# Patient Record
Sex: Male | Born: 1955 | Race: White | Hispanic: Yes | Marital: Single | Smoking: Never smoker
Health system: Southern US, Community
[De-identification: ages and names within clinical notes are randomized; demographics above are authoritative.]

## PROBLEM LIST (undated history)

## (undated) DIAGNOSIS — I340 Nonrheumatic mitral (valve) insufficiency: Secondary | ICD-10-CM

## (undated) DIAGNOSIS — E785 Hyperlipidemia, unspecified: Secondary | ICD-10-CM

## (undated) DIAGNOSIS — E119 Type 2 diabetes mellitus without complications: Secondary | ICD-10-CM

## (undated) DIAGNOSIS — I2510 Atherosclerotic heart disease of native coronary artery without angina pectoris: Secondary | ICD-10-CM

## (undated) DIAGNOSIS — Z789 Other specified health status: Secondary | ICD-10-CM

## (undated) DIAGNOSIS — I10 Essential (primary) hypertension: Secondary | ICD-10-CM

## (undated) HISTORY — DX: Hyperlipidemia, unspecified: E78.5

## (undated) HISTORY — DX: Type 2 diabetes mellitus without complications: E11.9

## (undated) HISTORY — PX: ANKLE SURGERY: SHX546

## (undated) HISTORY — DX: Essential (primary) hypertension: I10

## (undated) HISTORY — DX: Atherosclerotic heart disease of native coronary artery without angina pectoris: I25.10

## (undated) HISTORY — DX: Nonrheumatic mitral (valve) insufficiency: I34.0

---

## 1998-08-27 ENCOUNTER — Encounter: Admission: RE | Admit: 1998-08-27 | Discharge: 1998-11-25 | Payer: Self-pay | Admitting: *Deleted

## 1998-10-21 ENCOUNTER — Emergency Department (HOSPITAL_COMMUNITY): Admission: EM | Admit: 1998-10-21 | Discharge: 1998-10-21 | Payer: Self-pay

## 2009-08-01 ENCOUNTER — Emergency Department (HOSPITAL_COMMUNITY): Admission: EM | Admit: 2009-08-01 | Discharge: 2009-08-01 | Payer: Self-pay | Admitting: Emergency Medicine

## 2009-08-26 ENCOUNTER — Ambulatory Visit: Admission: RE | Admit: 2009-08-26 | Discharge: 2009-08-26 | Payer: Self-pay | Admitting: Orthopedic Surgery

## 2010-04-03 LAB — CBC
Hemoglobin: 16.5 g/dL (ref 13.0–17.0)
MCH: 30.2 pg (ref 26.0–34.0)
RBC: 5.46 MIL/uL (ref 4.22–5.81)
RDW: 12.5 % (ref 11.5–15.5)
WBC: 10.9 10*3/uL — ABNORMAL HIGH (ref 4.0–10.5)

## 2010-04-03 LAB — URINALYSIS, ROUTINE W REFLEX MICROSCOPIC
Glucose, UA: NEGATIVE mg/dL
Specific Gravity, Urine: 1.017 (ref 1.005–1.030)
pH: 6 (ref 5.0–8.0)

## 2010-04-03 LAB — COMPREHENSIVE METABOLIC PANEL
ALT: 57 U/L — ABNORMAL HIGH (ref 0–53)
Alkaline Phosphatase: 67 U/L (ref 39–117)
BUN: 12 mg/dL (ref 6–23)
Creatinine, Ser: 0.88 mg/dL (ref 0.4–1.5)
GFR calc non Af Amer: >60 mL/min (ref >60)
Potassium: 4.3 mEq/L (ref 3.5–5.1)

## 2010-04-03 LAB — URINE CULTURE
Colony Count: 4000
Culture  Setup Time: 201108091052

## 2010-04-03 LAB — ABO/RH: ABO/RH(D): O POS

## 2010-04-03 LAB — DIFFERENTIAL
Basophils Relative: 2 % — ABNORMAL HIGH (ref 0–1)
Eosinophils Absolute: 1.5 10*3/uL — ABNORMAL HIGH (ref 0.0–0.7)
Lymphs Abs: 4 10*3/uL (ref 0.7–4.0)
Monocytes Absolute: 0.8 10*3/uL (ref 0.1–1.0)
Neutro Abs: 4.4 10*3/uL (ref 1.7–7.7)

## 2010-04-03 LAB — SURGICAL PCR SCREEN
MRSA, PCR: NEGATIVE
Staphylococcus aureus: POSITIVE — AB

## 2010-04-03 LAB — TYPE AND SCREEN
ABO/RH(D): O POS
Antibody Screen: NEGATIVE

## 2010-04-03 LAB — PROTIME-INR: INR: 0.96 (ref 0.00–1.49)

## 2014-08-18 ENCOUNTER — Emergency Department (HOSPITAL_COMMUNITY): Payer: 59

## 2014-08-18 ENCOUNTER — Encounter (HOSPITAL_COMMUNITY): Payer: Self-pay | Admitting: *Deleted

## 2014-08-18 ENCOUNTER — Observation Stay (HOSPITAL_COMMUNITY)
Admission: EM | Admit: 2014-08-18 | Discharge: 2014-08-19 | Disposition: A | Discharge status: Home / Self Care | Payer: 59 | Attending: Internal Medicine | Admitting: Internal Medicine

## 2014-08-18 DIAGNOSIS — J45909 Unspecified asthma, uncomplicated: Secondary | ICD-10-CM | POA: Insufficient documentation

## 2014-08-18 DIAGNOSIS — R5383 Other fatigue: Secondary | ICD-10-CM | POA: Diagnosis not present

## 2014-08-18 DIAGNOSIS — Z8249 Family history of ischemic heart disease and other diseases of the circulatory system: Secondary | ICD-10-CM | POA: Insufficient documentation

## 2014-08-18 DIAGNOSIS — R079 Chest pain, unspecified: Principal | ICD-10-CM | POA: Insufficient documentation

## 2014-08-18 DIAGNOSIS — R0602 Shortness of breath: Secondary | ICD-10-CM | POA: Insufficient documentation

## 2014-08-18 DIAGNOSIS — Z791 Long term (current) use of non-steroidal anti-inflammatories (NSAID): Secondary | ICD-10-CM | POA: Diagnosis not present

## 2014-08-18 DIAGNOSIS — R9431 Abnormal electrocardiogram [ECG] [EKG]: Secondary | ICD-10-CM | POA: Diagnosis not present

## 2014-08-18 DIAGNOSIS — R0789 Other chest pain: Secondary | ICD-10-CM | POA: Diagnosis present

## 2014-08-18 HISTORY — DX: Other specified health status: Z78.9

## 2014-08-18 LAB — CBC
HCT: 41.9 % (ref 39.0–52.0)
Hemoglobin: 15 g/dL (ref 13.0–17.0)
MCH: 30.2 pg (ref 26.0–34.0)
MCHC: 35.8 g/dL (ref 30.0–36.0)
MCV: 84.3 fL (ref 78.0–100.0)
PLATELETS: 168 10*3/uL (ref 150–400)
RBC: 4.97 MIL/uL (ref 4.22–5.81)
RDW: 12.6 % (ref 11.5–15.5)
WBC: 13.8 10*3/uL — AB (ref 4.0–10.5)

## 2014-08-18 LAB — BRAIN NATRIURETIC PEPTIDE: B Natriuretic Peptide: 8.7 pg/mL (ref 0.0–100.0)

## 2014-08-18 LAB — TROPONIN I
Troponin I: <0.03 ng/mL (ref <0.031)
Troponin I: <0.03 ng/mL (ref <0.031)

## 2014-08-18 LAB — BASIC METABOLIC PANEL
ANION GAP: 8 (ref 5–15)
BUN: 10 mg/dL (ref 6–20)
CHLORIDE: 102 mmol/L (ref 101–111)
CO2: 24 mmol/L (ref 22–32)
CREATININE: 1.11 mg/dL (ref 0.61–1.24)
Calcium: 9.2 mg/dL (ref 8.9–10.3)
Glucose, Bld: 199 mg/dL — ABNORMAL HIGH (ref 65–99)
Potassium: 3.3 mmol/L — ABNORMAL LOW (ref 3.5–5.1)
Sodium: 134 mmol/L — ABNORMAL LOW (ref 135–145)

## 2014-08-18 LAB — TSH: TSH: 3.858 u[IU]/mL (ref 0.350–4.500)

## 2014-08-18 LAB — D-DIMER, QUANTITATIVE: D-Dimer, Quant: <0.27 ug/mL-FEU (ref 0.00–0.48)

## 2014-08-18 MED ORDER — NITROGLYCERIN 0.4 MG SL SUBL: 0.4000 mg | SUBLINGUAL_TABLET | SUBLINGUAL | Status: DC | PRN

## 2014-08-18 MED ORDER — ASPIRIN EC 325 MG PO TBEC
325.0000 mg | DELAYED_RELEASE_TABLET | Freq: Every day | ORAL | Status: DC
Start: 2014-08-18 — End: 2014-08-19
  Administered 2014-08-18 – 2014-08-19 (×2): 325 mg via ORAL
  Filled 2014-08-18 (×2): qty 1

## 2014-08-18 MED ORDER — ONDANSETRON HCL 4 MG/2ML IJ SOLN: 4.0000 mg | Freq: Four times a day (QID) | INTRAMUSCULAR | Status: DC | PRN

## 2014-08-18 MED ORDER — ACETAMINOPHEN 325 MG PO TABS: 650.0000 mg | ORAL_TABLET | ORAL | Status: DC | PRN

## 2014-08-18 MED ORDER — ASPIRIN 81 MG PO CHEW
324.0000 mg | CHEWABLE_TABLET | Freq: Once | ORAL | Status: AC
  Administered 2014-08-18: 324 mg via ORAL
  Filled 2014-08-18: qty 4

## 2014-08-18 MED ORDER — ENOXAPARIN SODIUM 30 MG/0.3ML ~~LOC~~ SOLN
30.0000 mg | SUBCUTANEOUS | Status: DC
  Administered 2014-08-19: 30 mg via SUBCUTANEOUS
  Filled 2014-08-18: qty 0.3

## 2014-08-18 NOTE — H&P (Signed)
[facility] History and Physical  Darin Jones ZOX:[id number] DOB: June 20, 1955 DOA: 08/18/2014  Referring physician: ER physician. PCP: No primary care provider on file. none. Specialists: None.  Chief Complaint: Chest pain.  HPI: Darin Jones is a 59 y.o. male with no significant past medical history presents to the ER because of chest tightness and shortness of breath and palpitations. Patient states that this morning patient was working when patient started developing chest tightness with some palpitations and shortness of breath. Patient's symptoms improved after patient taking rest. Patient had a similar episode last week while exerting and patient had gone to the urgent care. Patient states he was discharged from there and was told that he had elevated glucose and lipids which will need further follow-up. In the ER patient was found to have negative cardiac markers and EKG showing sinus tachycardia and a chest x-ray unremarkable. Patient presently is asymptomatic and heart rate is within acceptable limits. Patient denies any productive cough fever or chills.   Review of Systems: As presented in the history of presenting illness, rest negative.  Past Medical History  Diagnosis Date  . Medical history non-contributory    Past Surgical History  Procedure Laterality Date  . Ankle surgery     Social History:  reports that he has never smoked. He does not have any smokeless tobacco history on file. He reports that he drinks alcohol. He reports that he does not use illicit drugs. Where does patient live at home. Can patient participate in ADLs? Yes.  No Known Allergies  Family History:  Family History  Problem Relation Age of Onset  . Hypertension Mother   . CAD Mother       Prior to Admission medications   Medication Sig Start Date End Date Taking? Authorizing Provider  cyclobenzaprine (FLEXERIL) 10 MG tablet Take 10 mg by mouth at bedtime.   Yes Historical Provider, MD   ibuprofen (ADVIL,MOTRIN) 200 MG tablet Take 400 mg by mouth daily as needed for headache (pain).   Yes Historical Provider, MD  naproxen (NAPROSYN) 500 MG tablet Take 500 mg by mouth 2 (two) times daily with a meal.   Yes Historical Provider, MD  SALINE NASAL SPRAY NA Place 1 spray into both nostrils at bedtime as needed (congestion/ nasal drip).   Yes Historical Provider, MD    Physical Exam: Filed Vitals:   08/18/14 1945 08/18/14 2000 08/18/14 2015 08/18/14 2101  BP: 131/85 130/84 134/85 130/98  Pulse: 80 81 81 86  Temp:    98.2 F (36.8 C)  TempSrc:    Oral  Resp: 15 13 10 14   Height:    5\' 6"  (1.676 m)  Weight:    75.161 kg (165 lb 11.2 oz)  SpO2: 97% 96% 98% 100%     General:  Moderately built and nourished.  Eyes: Anicteric pallor.  ENT: No discharge from the ears eyes nose and mouth.  Neck: No mass felt.  Cardiovascular: S1 and S2 heard.  Respiratory: No rhonchi or crepitations.  Abdomen: Soft nontender bowel sounds present.  Skin: No rash.  Musculoskeletal: No edema.  Psychiatric: Appears normal.  Neurologic: Alert awake oriented to time place and person. Moves all extremities.  Labs on Admission:  Basic Metabolic Panel:  Recent Labs Lab 08/18/14 1530  NA 134*  K 3.3*  CL 102  CO2 24  GLUCOSE 199*  BUN 10  CREATININE 1.11  CALCIUM 9.2   Liver Function Tests: No results for input(s): AST, ALT, ALKPHOS, BILITOT,  PROT, ALBUMIN in the last 168 hours. No results for input(s): LIPASE, AMYLASE in the last 168 hours. No results for input(s): AMMONIA in the last 168 hours. CBC:  Recent Labs Lab 08/18/14 1530  WBC 13.8*  HGB 15.0  HCT 41.9  MCV 84.3  PLT 168   Cardiac Enzymes:  Recent Labs Lab 08/18/14 1530 08/18/14 1931  TROPONINI <0.03 <0.03    BNP (last 3 results)  Recent Labs  08/18/14 1530  BNP 8.7    ProBNP (last 3 results) No results for input(s): PROBNP in the last 8760 hours.  CBG: No results for input(s): GLUCAP in  the last 168 hours.  Radiological Exams on Admission: Dg Chest 2 View  08/18/2014   CLINICAL DATA:  Chest pain shortness of breath for 7 days.  Asthma.  EXAM: CHEST  2 VIEW  COMPARISON:  None.  FINDINGS: The heart size and mediastinal contours are within normal limits. Both lungs are clear. No evidence of pneumothorax or pleural effusion. The visualized skeletal structures are unremarkable.  IMPRESSION: No active cardiopulmonary disease.   Electronically Signed   By: Myles Rosenthal M.D.   On: 08/18/2014 16:14    EKG: Independently reviewed. Sinus tachycardia.  Assessment/Plan Principal Problem:   Chest pain Active Problems:   Shortness of breath   1. Chest pain - given the exertional symptoms and recent diagnosis of elevated lipids and glucose levels at this time we will cycle cardiac markers and keep patient nothing by mouth in a.m. in anticipation of cardiac procedure. When necessary nitroglycerin. Aspirin. Check 2-D echo. Check d-dimer. 2. Hyperglycemia - check hemoglobin A1c.  I have reviewed patient's chest x-ray and EKG personally.   DVT Prophylaxis Lovenox.  Code Status: Full code.  Family Communication: Discussed with patient.  Disposition Plan: Admit for observation.    Minta Fair N. [facility] Pager [phone].  If 7PM-7AM, please contact night-coverage [email or web address] Password [facility] 08/18/2014, 9:27 PM

## 2014-08-18 NOTE — ED Provider Notes (Signed)
CSN: [id number]     Arrival date & time 08/18/14  1458 History   First MD Initiated Contact with Patient 08/18/14 1559     Chief Complaint  Patient presents with  . Shortness of Breath     (Consider location/radiation/quality/duration/timing/severity/associated sxs/prior Treatment) HPI Comments: 59 year old male with no significant past medical history who presents with shortness of breath and fatigue. The patient states that today after he cleaned his house and vacuumed, he began having heart palpitations and shortness of breath. These symptoms eventually went away after resting. Last week after cleaning a house, he had central chest pressure associated with left face and left arm tingling. He also felt heart racing and shortness of breath at this time. During that episode, his symptoms lasted approximately 1-2 hours and then improved with rest. The past 1.5 weeks, he has had shortness of breath when walking upstairs which is unusual for him. He denies any shortness of breath or chest pain currently. No fevers, cough/cold symptoms, nausea/vomiting, abdominal pain, or recent illness.  Family history negative for cardiac disease. Mother with type 2 diabetes mellitus and hypertension.  Patient is a 59 y.o. male presenting with shortness of breath. The history is provided by the patient.  Shortness of Breath   History reviewed. No pertinent past medical history. History reviewed. No pertinent past surgical history. No family history on file. History  Substance Use Topics  . Smoking status: Never Smoker   . Smokeless tobacco: Not on file  . Alcohol Use: Yes     Comment: occ    Review of Systems  Respiratory: Positive for shortness of breath.    10 Systems reviewed and are negative for acute change except as noted in the HPI.    Allergies  Review of patient's allergies indicates no known allergies.  Home Medications   Prior to Admission medications   Medication Sig Start Date End  Date Taking? Authorizing Provider  cyclobenzaprine (FLEXERIL) 10 MG tablet Take 10 mg by mouth at bedtime.   Yes Historical Provider, MD  ibuprofen (ADVIL,MOTRIN) 200 MG tablet Take 400 mg by mouth daily as needed for headache (pain).   Yes Historical Provider, MD  naproxen (NAPROSYN) 500 MG tablet Take 500 mg by mouth 2 (two) times daily with a meal.   Yes Historical Provider, MD  SALINE NASAL SPRAY NA Place 1 spray into both nostrils at bedtime as needed (congestion/ nasal drip).   Yes Historical Provider, MD   BP 130/84 mmHg  Pulse 91  Temp(Src) 99 F (37.2 C) (Oral)  Resp 22  Ht  (1.676 m)  Wt 170 lb (77.111 kg)  BMI 27.45 kg/m2  SpO2 98% Physical Exam  Constitutional: He is oriented to person, place, and time. He appears well-developed and well-nourished. No distress.  HENT:  Head: Normocephalic and atraumatic.  Moist mucous membranes  Eyes: Conjunctivae are normal. Pupils are equal, round, and reactive to light.  Neck: Neck supple.  Cardiovascular: Normal rate, regular rhythm and normal heart sounds.   No murmur heard. Pulmonary/Chest: Effort normal and breath sounds normal.  Abdominal: Soft. Bowel sounds are normal. He exhibits no distension. There is no tenderness.  Musculoskeletal: He exhibits no edema.  Neurological: He is alert and oriented to person, place, and time.  Fluent speech  Skin: Skin is warm and dry.  Psychiatric: He has a normal mood and affect. Judgment normal.  Nursing note and vitals reviewed.   ED Course  Procedures (including critical care time) Labs Review Labs Reviewed  BASIC METABOLIC PANEL - Abnormal; Notable for the following:    Sodium 134 (*)    Potassium 3.3 (*)    Glucose, Bld 199 (*)    All other components within normal limits  CBC - Abnormal; Notable for the following:    WBC 13.8 (*)    All other components within normal limits  TROPONIN I  BRAIN NATRIURETIC PEPTIDE  TROPONIN I    Imaging Review Dg Chest 2  View  08/18/2014   CLINICAL DATA:  Chest pain shortness of breath for 7 days.  Asthma.  EXAM: CHEST  2 VIEW  COMPARISON:  None.  FINDINGS: The heart size and mediastinal contours are within normal limits. Both lungs are clear. No evidence of pneumothorax or pleural effusion. The visualized skeletal structures are unremarkable.  IMPRESSION: No active cardiopulmonary disease.   Electronically Signed   By: Myles Rosenthal M.D.   On: 08/18/2014 16:14     EKG Interpretation   Date/Time:  Sunday August 18 2014 15:07:28 EDT Ventricular Rate:  116 PR Interval:  144 QRS Duration: 84 QT Interval:  320 QTC Calculation: 444 R Axis:   125 Text Interpretation:  Sinus tachycardia Right axis deviation Abnormal ECG  ABNORMAL R WAVE PROGRESSION Confirmed by Aissata Wilmore MD, Corisa Montini ([id number]) on  08/18/2014 4:05:07 PM       MDM   Final diagnoses:  None   shortness of breath with exertion Heart palpitations with exertion Fatigue Chest tightness  59 year old male presents with episode of shortness of breath and palpitation after vacuuming. Has had recent episode of chest pain and left arm tingling associated with shortness of breath after exertion. Patient well-appearing with reassuring vital signs at presentation. EKG shows no signs of ischemia. Obtained labs listed above including serial troponins. Initial troponin negative. Patient has no risk factors for blood clots thus I feel PE is unlikely. Chest x-ray unremarkable. Gave the patient 324 mg aspirin. He currently denies any chest pain or tightness.  Patient's heart score is at least 6 given his age, suspicious history and abnormal R-wave progression on EKG. I am concerned that his symptoms may represent unstable angina. The patient will be admitted for ACS rule out.  Laurence Spates, MD 08/18/14 [phone]

## 2014-08-18 NOTE — ED Notes (Signed)
Pt states sob and palpitations while vacuuming today.  He went to [facility] and they sent him here.  States last week he felt chest pressure, L facial tingling and that his heart was racing, but it only lasted several minutes.  Also c/o sob when going up stairs x 1.5 weeks.

## 2014-08-19 ENCOUNTER — Encounter: Payer: Self-pay | Admitting: Physician Assistant

## 2014-08-19 ENCOUNTER — Other Ambulatory Visit: Payer: Self-pay | Admitting: Physician Assistant

## 2014-08-19 ENCOUNTER — Observation Stay (HOSPITAL_BASED_OUTPATIENT_CLINIC_OR_DEPARTMENT_OTHER): Payer: 59

## 2014-08-19 DIAGNOSIS — Z791 Long term (current) use of non-steroidal anti-inflammatories (NSAID): Secondary | ICD-10-CM | POA: Diagnosis not present

## 2014-08-19 DIAGNOSIS — R079 Chest pain, unspecified: Secondary | ICD-10-CM

## 2014-08-19 DIAGNOSIS — R0602 Shortness of breath: Secondary | ICD-10-CM | POA: Diagnosis not present

## 2014-08-19 DIAGNOSIS — J45909 Unspecified asthma, uncomplicated: Secondary | ICD-10-CM | POA: Diagnosis not present

## 2014-08-19 LAB — TROPONIN I
Troponin I: <0.03 ng/mL (ref <0.031)
Troponin I: <0.03 ng/mL (ref <0.031)

## 2014-08-19 LAB — BASIC METABOLIC PANEL
ANION GAP: 10 (ref 5–15)
BUN: 13 mg/dL (ref 6–20)
CO2: 27 mmol/L (ref 22–32)
CREATININE: 0.83 mg/dL (ref 0.61–1.24)
Calcium: 9.2 mg/dL (ref 8.9–10.3)
Chloride: 103 mmol/L (ref 101–111)
Glucose, Bld: 103 mg/dL — ABNORMAL HIGH (ref 65–99)
Potassium: 3.8 mmol/L (ref 3.5–5.1)
Sodium: 140 mmol/L (ref 135–145)

## 2014-08-19 LAB — URINALYSIS, ROUTINE W REFLEX MICROSCOPIC
BILIRUBIN URINE: NEGATIVE
Glucose, UA: NEGATIVE mg/dL
HGB URINE DIPSTICK: NEGATIVE
Ketones, ur: NEGATIVE mg/dL
LEUKOCYTES UA: NEGATIVE
Nitrite: NEGATIVE
PH: 6 (ref 5.0–8.0)
PROTEIN: NEGATIVE mg/dL
Specific Gravity, Urine: 1.023 (ref 1.005–1.030)
Urobilinogen, UA: 0.2 mg/dL (ref 0.0–1.0)

## 2014-08-19 LAB — LIPID PANEL
Cholesterol: 169 mg/dL (ref 0–200)
HDL: 42 mg/dL (ref >40)
LDL CALC: 109 mg/dL — AB (ref 0–99)
Total CHOL/HDL Ratio: 4 RATIO
Triglycerides: 91 mg/dL (ref <150)
VLDL: 18 mg/dL (ref 0–40)

## 2014-08-19 LAB — RAPID URINE DRUG SCREEN, HOSP PERFORMED
Amphetamines: NOT DETECTED
Barbiturates: NOT DETECTED
Benzodiazepines: NOT DETECTED
COCAINE: NOT DETECTED
OPIATES: NOT DETECTED
TETRAHYDROCANNABINOL: NOT DETECTED

## 2014-08-19 MED ORDER — ASPIRIN EC 81 MG PO TBEC: 81.0000 mg | DELAYED_RELEASE_TABLET | Freq: Every day | ORAL | Status: AC

## 2014-08-19 MED ORDER — NITROGLYCERIN 0.4 MG SL SUBL: 0.4000 mg | SUBLINGUAL_TABLET | SUBLINGUAL | Status: DC | PRN

## 2014-08-19 MED ORDER — UNABLE TO FIND: Status: DC

## 2014-08-19 NOTE — Discharge Summary (Signed)
[facility]  Physician Discharge Summary   Patient ID: Darin Jones MRN: [id number] DOB/AGE: 57-Jan-1957 57 y.o.  Admit date: 08/18/2014 Discharge date: 08/19/2014  PCP: Does not recall name  DISCHARGE DIAGNOSES:  Principal Problem:   Chest pain Active Problems:   Shortness of breath   RECOMMENDATIONS FOR OUTPATIENT FOLLOW UP: 1. Patient told that cardiology office will call for an outpatient stress test  DISCHARGE CONDITION: fair  Diet recommendation: Heart healthy  Filed Weights   08/18/14 1515 08/18/14 2101  Weight: 77.111 kg (170 lb) 75.161 kg (165 lb 11.2 oz)    INITIAL HISTORY: 59 year old male of Hispanic origin who does not have any significant past medical history, but presented with complaints of chest tightness and shortness of breath with exertion. He was hospitalized for further evaluation.  Consultations:  None  Procedures: 2-D echocardiogram Final report is pending for technical reasons. Discussed with Dr. Tenny Craw who reviewed his echocardiogram and mentioned that his ejection fraction was 55-60%. No other abnormalities were noted.  HOSPITAL COURSE:   Chest pain with exertion EKG was nonischemic. It was repeated this evening, which was also unremarkable. Troponins are negative 3. Patient's chest pain has resolved. Echocardiogram did not show any concerning findings. Patient will still benefit from outpatient stress test, which will be arranged. Cardiology will contact him. He'll be given prescriptions for aspirin and nitroglycerin. D Dimer is normal. TSh is normal.  His blood glucose level was elevated yesterday. Fasting level is 103. This will need outpatient management.  Patient has remained chest pain-free in the hospital. He is ruled out for acute coronary syndrome. His EKG is nonischemic. Stress test can be done as an outpatient.    PERTINENT LABS:  The results of significant diagnostics from this hospitalization (including imaging,  microbiology, ancillary and laboratory) are listed below for reference.      Labs: Basic Metabolic Panel:  Recent Labs Lab 08/18/14 1530 08/19/14 1130  NA 134* 140  K 3.3* 3.8  CL 102 103  CO2 24 27  GLUCOSE 199* 103*  BUN 10 13  CREATININE 1.11 0.83  CALCIUM 9.2 9.2   CBC:  Recent Labs Lab 08/18/14 1530  WBC 13.8*  HGB 15.0  HCT 41.9  MCV 84.3  PLT 168   Cardiac Enzymes:  Recent Labs Lab 08/18/14 1530 08/18/14 1931 08/18/14 2146 08/19/14 0327 08/19/14 1010  TROPONINI <0.03 <0.03 <0.03 <0.03 <0.03   BNP: BNP (last 3 results)  Recent Labs  08/18/14 1530  BNP 8.7    IMAGING STUDIES Dg Chest 2 View  08/18/2014   CLINICAL DATA:  Chest pain shortness of breath for 7 days.  Asthma.  EXAM: CHEST  2 VIEW  COMPARISON:  None.  FINDINGS: The heart size and mediastinal contours are within normal limits. Both lungs are clear. No evidence of pneumothorax or pleural effusion. The visualized skeletal structures are unremarkable.  IMPRESSION: No active cardiopulmonary disease.   Electronically Signed   By: Myles Rosenthal M.D.   On: 08/18/2014 16:14    DISCHARGE EXAMINATION: Filed Vitals:   08/18/14 2015 08/18/14 2101 08/19/14 0532 08/19/14 1316  BP: 134/85 130/98 114/82 128/72  Pulse: 81 86 77 80  Temp:  98.2 F (36.8 C) 97.8 F (36.6 C) 98.2 F (36.8 C)  TempSrc:  Oral Oral Oral  Resp: Height:   (1.676 m)    Weight:  75.161 kg (165 lb 11.2 oz)    SpO2: 98% 100% 100% 100%   General appearance:  alert, cooperative and no distress Resp: clear to auscultation bilaterally Cardio: regular rate and rhythm, S1, S2 normal, no murmur, click, rub or gallop GI: soft, non-tender; bowel sounds normal; no masses,  no organomegaly Extremities: extremities normal, atraumatic, no cyanosis or edema  DISPOSITION: Home  Discharge Instructions    Call MD for:  difficulty breathing, headache or visual disturbances    Complete by:  As directed      Call MD for:   extreme fatigue    Complete by:  As directed      Call MD for:  persistant dizziness or light-headedness    Complete by:  As directed      Call MD for:  persistant nausea and vomiting    Complete by:  As directed      Call MD for:  severe uncontrolled pain    Complete by:  As directed      Diet - low sodium heart healthy    Complete by:  As directed      Discharge instructions    Complete by:  As directed   Take the nitroglycerin only if you have chest pain and Seek immediate attention if you develop chest pain. You will get a call from the heart doctor's office regarding a cardiac stress test.    You were cared for by a hospitalist during your hospital stay. If you have any questions about your discharge medications or the care you received while you were in the hospital after you are discharged, you can call the unit and asked to speak with the hospitalist on call if the hospitalist that took care of you is not available. Once you are discharged, your primary care physician will handle any further medical issues. Please note that NO REFILLS for any discharge medications will be authorized once you are discharged, as it is imperative that you return to your primary care physician (or establish a relationship with a primary care physician if you do not have one) for your aftercare needs so that they can reassess your need for medications and monitor your lab values. If you do not have a primary care physician, you can call [phone] for a physician referral.     Increase activity slowly    Complete by:  As directed            ALLERGIES: No Known Allergies    Current Discharge Medication List    START taking these medications   Details  aspirin EC 81 MG tablet Take 1 tablet (81 mg total) by mouth daily. Qty: 30 tablet, Refills: 0    nitroGLYCERIN (NITROSTAT) 0.4 MG SL tablet Place 1 tablet (0.4 mg total) under the tongue every 5 (five) minutes as needed for chest pain. Qty: 15  tablet, Refills: 0    UNABLE TO FIND To Whom It May Concern: It is my medical opinion that Darin Jones should stay out of work till he undergoes a stress test. If you have any questions or concerns, please don't hesitate to call. Sincerely, Darin Shipper, MD [phone] [facility] Qty: 1 each, Refills: 0      CONTINUE these medications which have NOT CHANGED   Details  cyclobenzaprine (FLEXERIL) 10 MG tablet Take 10 mg by mouth at bedtime.    naproxen (NAPROSYN) 500 MG tablet Take 500 mg by mouth 2 (two) times daily with a meal.    SALINE NASAL SPRAY NA Place 1 spray into both nostrils at bedtime as needed (congestion/ nasal drip).  STOP taking these medications     ibuprofen (ADVIL,MOTRIN) 200 MG tablet          TOTAL DISCHARGE TIME: 35 minutes  [facility]  [facility] Pager [phone]  08/19/2014, 6:03 PM

## 2014-08-19 NOTE — Progress Notes (Signed)
Chaplain responded to request for advanced directive. Pt did not recall making request. Pt stated that he would want his wife to make medical decisions for him if he could not make them for himself. Chaplain left copy of advanced directive in Spanish for pt should he want to complete it. Pt is concerned about not eating since yesterday and wonders about what diet is ordered. Also curious about when he will see a doctor.  Chaplain offered to speak with pt nurse about this. Page chaplain as needed.   08/19/14 1000  Clinical Encounter Type  Visited With Patient  Visit Type Initial;Spiritual support  Referral From Nurse  Spiritual Encounters  Spiritual Needs Emotional  Stress Factors  Patient Stress Factors Lack of knowledge  Advance Directives (For Healthcare)  Does patient have an advance directive? No  Would patient like information on creating an advanced directive? Yes - Educational materials given  Jiles Harold 08/19/2014 10:07 AM

## 2014-08-19 NOTE — Progress Notes (Signed)
  Echocardiogram 2D Echocardiogram has been performed.  Darin Jones 08/19/2014, 3:38 PM

## 2014-08-19 NOTE — Progress Notes (Signed)
Patient to be discharged home. AVS reviewed with patient and prescriptions given. Awaiting wife for patient pickup.

## 2014-08-19 NOTE — Progress Notes (Signed)
Dr. Rito Ehrlich with internal medicine contacted our service this evening after discussing echo result with Dr. Tenny Craw, requesting outpatient exercise stress test. I have sent a message to our [facility] office's scheduler requesting this test as well as a follow-up appointment, and our office will call the patient with this information. Liala Codispoti PA-C

## 2014-08-19 NOTE — Discharge Instructions (Signed)
Dolor de pecho (no específico) °(Chest Pain (Nonspecific)) °Suele ser difícil diagnosticar la causa del dolor de pecho. Siempre hay una posibilidad de que el dolor podría estar relacionado con algo grave, como un ataque al corazón o un coágulo sanguíneo en los pulmones. Debe concurrir a las visitas de control con el médico. °CUIDADOS EN EL HOGAR °· Si le dieron antibióticos, tómelos como se lo haya indicado el médico. Finalice el medicamento, aunque comience a sentirse mejor. °· Durante los días siguientes, no haga actividades que provoquen dolor de pecho. Continúe con las actividades físicas como se lo haya indicado el médico. °· No use productos que contengan tabaco, que incluyen cigarrillos, tabaco para mascar y cigarrillos electrónicos. °· Evite el consumo de alcohol. °· Tome los medicamentos solamente como se lo haya indicado el médico. °· Siga las sugerencias del médico en lo que respecta a más pruebas, si el dolor de pecho no desaparece. °· Concurra a todas las visitas que concertó con el médico. °SOLICITE AYUDA SI: °· El dolor de pecho no desaparece, incluso después del tratamiento. °· Tiene una erupción cutánea con ampollas en el pecho. °· Tiene fiebre. °SOLICITE AYUDA DE INMEDIATO SI:  °· Aumenta el dolor de pecho o el dolor se irradia hacia el brazo, el cuello, la mandíbula, la espalda o el vientre (abdomen). °· Le falta el aire. °· Tose más de lo normal o tose con sangre. °· Siente un dolor muy intenso en la espalda o el vientre. °· Tiene malestar estomacal (náuseas) o vomita. °· Se siente muy débil. °· Pierde el conocimiento (se desmaya). °· Tiene escalofríos. °Esto es una emergencia. No espere a ver que los problemas desaparezcan. Llame a los servicios de emergencia locales (911 en los [place]). No conduzca por sus propios medios hasta el hospital. °ASEGÚRESE DE QUE:  °· Comprende estas instrucciones. °· Controlará su afección. °· Recibirá ayuda de inmediato si no mejora o si empeora. °Document  Released: 04/02/2008 Document Revised: 01/09/2013 °ExitCare® Patient Information ©2015 [employer]. This information is not intended to replace advice given to you by your health care provider. Make sure you discuss any questions you have with your health care provider. ° °

## 2014-08-20 ENCOUNTER — Telehealth (HOSPITAL_COMMUNITY): Payer: Self-pay | Admitting: *Deleted

## 2014-08-20 LAB — HEMOGLOBIN A1C
Hgb A1c MFr Bld: 6.3 % — ABNORMAL HIGH (ref 4.8–5.6)
Mean Plasma Glucose: 134 mg/dL

## 2014-08-22 ENCOUNTER — Telehealth (HOSPITAL_COMMUNITY): Payer: Self-pay | Admitting: *Deleted

## 2014-08-22 NOTE — Telephone Encounter (Signed)
Attempted to message in reference to upcoming appointment scheduled for 08/23/14. No answer machine in order to leave message. Kanishk Stroebel, Adelene Idler

## 2014-08-23 ENCOUNTER — Ambulatory Visit (HOSPITAL_COMMUNITY): Payer: 59 | Attending: Physician Assistant

## 2014-08-23 DIAGNOSIS — R9439 Abnormal result of other cardiovascular function study: Secondary | ICD-10-CM | POA: Diagnosis not present

## 2014-08-23 DIAGNOSIS — R0609 Other forms of dyspnea: Secondary | ICD-10-CM | POA: Insufficient documentation

## 2014-08-23 DIAGNOSIS — R079 Chest pain, unspecified: Secondary | ICD-10-CM | POA: Diagnosis present

## 2014-08-23 LAB — MYOCARDIAL PERFUSION IMAGING
CHL CUP NUCLEAR SRS: 2
CHL CUP RESTING HR STRESS: 90 {beats}/min
CHL RATE OF PERCEIVED EXERTION: 17
CSEPED: 6 min
CSEPPHR: 157 {beats}/min
Estimated workload: 7.7 METS
Exercise duration (sec): 30 s
LHR: 0.31
LV dias vol: 85 mL
LV sys vol: 36 mL
MPHR: 164 {beats}/min
NUC STRESS TID: 0.9
Percent HR: 95 %
SDS: 3
SSS: 5

## 2014-08-23 MED ORDER — TECHNETIUM TC 99M SESTAMIBI GENERIC - CARDIOLITE
10.2000 | Freq: Once | INTRAVENOUS | Status: AC | PRN
  Administered 2014-08-23: 10 via INTRAVENOUS

## 2014-08-23 MED ORDER — TECHNETIUM TC 99M SESTAMIBI GENERIC - CARDIOLITE
32.6000 | Freq: Once | INTRAVENOUS | Status: AC | PRN
  Administered 2014-08-23: 33 via INTRAVENOUS

## 2014-08-26 ENCOUNTER — Telehealth: Payer: Self-pay | Admitting: Physician Assistant

## 2014-08-26 NOTE — Telephone Encounter (Signed)
Left detailed message for patient to call the office to be scheduled to see Wilburt Finlay, PA on Wednesday 8/10.  I have placed him on Bryan Hager's schedule as he is FLEX APP and this is urgent work-in.  Wednesday if first available appointment.

## 2014-08-26 NOTE — Telephone Encounter (Signed)
Patient's stress test result came to me and Dr. Tenny Craw. This is a patient seen by IM who recommended outpatient stress test. The internal medicine physician had discussed the patient's echo with Dr. Tenny Craw, but we have not ever seen the patient. Stress test was abnormal, possibly suggestive of ischemia.       Nuclear stress EF: 58%.  There was no ST segment deviation noted during stress.  Findings consistent with ischemia.  The left ventricular ejection fraction is normal (55-65%).  This is an intermediate risk study.  There was a medium-sized, mild partially reversible basal to mid inferior perfusion defect. This was suggestive of possible infarct with significant peri-infarct ischemia. Normal EF and wall motion.    As Dr. Tenny Craw is out this week, I discussed with Dr. Patty Sermons. Triage - Please let patient know his stress test was abnormal and needs further discussion - please arrange ASAP new patient appointment. This is appropriate to add onto flex schedule as a new patient with high priority. Continue aspirin in the meantime. Please review ER precautions the patients and ask him to please return for worsening symptoms.  Dayna Dunn PA-C

## 2014-08-27 NOTE — Telephone Encounter (Signed)
Spoke with pt to be sure that he had received message about appt tomorrow with Wilburt Finlay. Pt aware of appt on 8/10 @ 1:30P with Wilburt Finlay. Pt states that he will be here tomorrow.

## 2014-08-28 ENCOUNTER — Encounter: Payer: Self-pay | Admitting: Physician Assistant

## 2014-08-28 ENCOUNTER — Ambulatory Visit (INDEPENDENT_AMBULATORY_CARE_PROVIDER_SITE_OTHER): Payer: 59 | Admitting: Physician Assistant

## 2014-08-28 VITALS — BP 122/80 | HR 100 | Ht 66.0 in | Wt 168.2 lb

## 2014-08-28 DIAGNOSIS — R931 Abnormal findings on diagnostic imaging of heart and coronary circulation: Secondary | ICD-10-CM | POA: Diagnosis not present

## 2014-08-28 DIAGNOSIS — R9439 Abnormal result of other cardiovascular function study: Secondary | ICD-10-CM

## 2014-08-28 LAB — CBC WITH DIFFERENTIAL/PLATELET
Basophils Absolute: 0.2 10*3/uL — ABNORMAL HIGH (ref 0.0–0.1)
Basophils Relative: 1.6 % (ref 0.0–3.0)
EOS ABS: 1.2 10*3/uL — AB (ref 0.0–0.7)
EOS PCT: 11.3 % — AB (ref 0.0–5.0)
HEMATOCRIT: 44.2 % (ref 39.0–52.0)
Hemoglobin: 15 g/dL (ref 13.0–17.0)
LYMPHS ABS: 2.9 10*3/uL (ref 0.7–4.0)
Lymphocytes Relative: 27 % (ref 12.0–46.0)
MCHC: 33.8 g/dL (ref 30.0–36.0)
MCV: 87.2 fl (ref 78.0–100.0)
MONOS PCT: 8.5 % (ref 3.0–12.0)
Monocytes Absolute: 0.9 10*3/uL (ref 0.1–1.0)
Neutro Abs: 5.5 10*3/uL (ref 1.4–7.7)
Neutrophils Relative %: 51.6 % (ref 43.0–77.0)
Platelets: 176 10*3/uL (ref 150.0–400.0)
RBC: 5.07 Mil/uL (ref 4.22–5.81)
RDW: 12.8 % (ref 11.5–15.5)
WBC: 10.6 10*3/uL — AB (ref 4.0–10.5)

## 2014-08-28 LAB — PROTIME-INR
INR: 1 ratio (ref 0.8–1.0)
Prothrombin Time: 11 s (ref 9.6–13.1)

## 2014-08-28 LAB — BASIC METABOLIC PANEL
BUN: 11 mg/dL (ref 6–23)
CALCIUM: 9.5 mg/dL (ref 8.4–10.5)
CO2: 30 mEq/L (ref 19–32)
CREATININE: 1.01 mg/dL (ref 0.40–1.50)
Chloride: 102 mEq/L (ref 96–112)
GFR: 81.03 mL/min (ref >60.00)
GLUCOSE: 104 mg/dL — AB (ref 70–99)
Potassium: 3.8 mEq/L (ref 3.5–5.1)
Sodium: 138 mEq/L (ref 135–145)

## 2014-08-28 MED ORDER — METOPROLOL TARTRATE 25 MG PO TABS: 12.5000 mg | ORAL_TABLET | Freq: Two times a day (BID) | ORAL | Status: DC

## 2014-08-28 NOTE — Patient Instructions (Signed)
Medication Instructions:  Your physician has recommended you make the following change in your medication:  START Metoprolol 12.5mg  (1/2 tablet) Twice Daily   Labwork: Bmet, Cbc, Pt/Inr today  Testing/Procedures: Your physician has requested that you have a cardiac catheterization. Cardiac catheterization is used to diagnose and/or treat various heart conditions. Doctors may recommend this procedure for a number of different reasons. The most common reason is to evaluate chest pain. Chest pain can be a symptom of coronary artery disease (CAD), and cardiac catheterization can show whether plaque is narrowing or blocking your heart's arteries. This procedure is also used to evaluate the valves, as well as measure the blood flow and oxygen levels in different parts of your heart. For further information please visit [email or web address] Please follow instruction sheet, as given.    Follow-Up: Your physician recommends that you schedule a follow-up appointment pending procedure   Any Other Special Instructions Will Be Listed Below (If Applicable).

## 2014-08-28 NOTE — Progress Notes (Addendum)
Patient ID: Darin Jones, male   DOB: 1955/09/06, 59 y.o.   MRN: [id number]    Date:  08/28/2014   ID:  Darin Jones, DOB July 23, 1955, MRN [id number]  PCP:  No primary care provider on file.  Primary Cardiologist:  New  No chief complaint on file.    History of Present Illness:  Darin Jones is a 59 y.o. male with no significant past medical history. He was seen at [facility] on July 31 for exertional chest pain and shortness of breath.  He ruled out for MI 2-D echocardiogram revealed ejection fraction 55-60% with no wall motion abnormalities.  He was set up and underwent nuclear stress testing as an outpatient which was read as having a medium-sized, mild partially reversible basal to mid inferior perfusion defect. This was suggestive of possible infarct with significant peri-infarct ischemia. Normal EF and wall motion.   Patient presented for follow-up with his stress test.  An interpreter was present during the interview and exam.  He appears to have periodic chest tightness and shortness of breath with activity but otherwise is doing well.  He seems to correlate nasal congestion with that tightness. Review of the hospital note indicates that was with exertion.  The patient currently denies nausea, vomiting, fever, shortness of breath, orthopnea, dizziness, PND, cough, abdominal pain, hematochezia, melena, lower extremity edema, claudication.  Wt Readings from Last 3 Encounters:  08/28/14 168 lb 3.2 oz (76.295 kg)  08/23/14 165 lb (74.844 kg)  08/18/14 165 lb 11.2 oz (75.161 kg)     Past Medical History  Diagnosis Date  . Medical history non-contributory     Current Outpatient Prescriptions  Medication Sig Dispense Refill  . aspirin EC 81 MG tablet Take 1 tablet (81 mg total) by mouth daily. 30 tablet 0  . cyclobenzaprine (FLEXERIL) 10 MG tablet Take 10 mg by mouth at bedtime.    . naproxen (NAPROSYN) 500 MG tablet Take 500 mg by mouth 2 (two) times daily with a meal.      . nitroGLYCERIN (NITROSTAT) 0.4 MG SL tablet Place 1 tablet (0.4 mg total) under the tongue every 5 (five) minutes as needed for chest pain. 15 tablet 0  . SALINE NASAL SPRAY NA Place 1 spray into both nostrils at bedtime as needed (congestion/ nasal drip).    [name] TO FIND To Whom It May Concern: It is my medical opinion that Darin Jones should stay out of work till he undergoes a stress test. If you have any questions or concerns, please don't hesitate to call. Sincerely, Osvaldo Shipper, MD [phone] [facility] 1 each 0   No current facility-administered medications for this visit.    Allergies:    Allergies  Allergen Reactions  . Plasticized Base [Plastibase] Rash    Social History:  The patient  reports that he has never smoked. He has never used smokeless tobacco. He reports that he drinks alcohol. He reports that he does not use illicit drugs.   Family history:   Family History  Problem Relation Age of Onset  . Hypertension Mother   . CAD Mother   . Diabetes Mother   . Asthma Father     ROS:  Please see the history of present illness.  All other systems reviewed and negative.   PHYSICAL EXAM: VS:  BP 122/80 mmHg  Pulse 100  Ht  (1.676 m)  Wt 168 lb 3.2 oz (76.295 kg)  BMI 27.16 kg/m2  SpO2 94% Well  nourished, well developed, in no acute distress HEENT: Pupils are equal round react to light accommodation extraocular movements are intact.  Neck: no JVDNo cervical lymphadenopathy. Cardiac: Regular rate and rhythm without murmurs rubs or gallops. Lungs:  clear to auscultation bilaterally, no wheezing, rhonchi or rales Abd: soft, nontender, positive bowel sounds all quadrants, no hepatosplenomegaly Ext: no lower extremity edema.  2+ radial and dorsalis pedis pulses. Skin: warm and dry Neuro:  Grossly normal    ASSESSMENT AND PLAN:  Abnormal nuclear stress test Medium-sized, mild partially reversible basal to mid inferior perfusion defect.  This was suggestive of possible infarct with significant peri-infarct ischemia.The patient will be scheduled for left heart catheterization.  We'll check all precatheterization labs.  The procedure and risks were reviewed including but not limited to death, myocardial infarction, stroke, arrythmias, bleeding, transfusion, emergency surgery, dye allergy, or renal dysfunction. The patient voices understanding and is agreeable to proceed.   We went over the procedure in detail through the use of an interpreter.  Showed him pictures of stents and how they are used.  I started him on Lopressor 12.5 mg twice daily. He is already taking aspirin.

## 2014-08-30 ENCOUNTER — Encounter (HOSPITAL_COMMUNITY)
Admission: RE | Disposition: A | Discharge status: Home / Self Care | Payer: Self-pay | Source: Ambulatory Visit | Attending: Cardiovascular Disease

## 2014-08-30 ENCOUNTER — Ambulatory Visit (HOSPITAL_COMMUNITY)
Admission: RE | Admit: 2014-08-30 | Discharge: 2014-08-30 | Disposition: A | Discharge status: Home / Self Care | Payer: 59 | Source: Ambulatory Visit | Attending: Cardiovascular Disease | Admitting: Cardiovascular Disease

## 2014-08-30 ENCOUNTER — Encounter (HOSPITAL_COMMUNITY): Payer: Self-pay | Admitting: Cardiovascular Disease

## 2014-08-30 DIAGNOSIS — Z7982 Long term (current) use of aspirin: Secondary | ICD-10-CM | POA: Insufficient documentation

## 2014-08-30 DIAGNOSIS — R072 Precordial pain: Secondary | ICD-10-CM | POA: Insufficient documentation

## 2014-08-30 DIAGNOSIS — R0789 Other chest pain: Secondary | ICD-10-CM | POA: Diagnosis not present

## 2014-08-30 DIAGNOSIS — I2510 Atherosclerotic heart disease of native coronary artery without angina pectoris: Secondary | ICD-10-CM | POA: Diagnosis not present

## 2014-08-30 DIAGNOSIS — Z8249 Family history of ischemic heart disease and other diseases of the circulatory system: Secondary | ICD-10-CM | POA: Diagnosis not present

## 2014-08-30 HISTORY — PX: CARDIAC CATHETERIZATION: SHX172

## 2014-08-30 SURGERY — LEFT HEART CATH AND CORONARY ANGIOGRAPHY
Anesthesia: LOCAL

## 2014-08-30 MED ORDER — VERAPAMIL HCL 2.5 MG/ML IV SOLN
INTRAVENOUS | Status: AC
  Filled 2014-08-30: qty 2

## 2014-08-30 MED ORDER — FENTANYL CITRATE (PF) 100 MCG/2ML IJ SOLN
INTRAMUSCULAR | Status: DC | PRN
  Administered 2014-08-30: 50 ug via INTRAVENOUS

## 2014-08-30 MED ORDER — SODIUM CHLORIDE 0.9 % WEIGHT BASED INFUSION
3.0000 mL/kg/h | INTRAVENOUS | Status: DC
  Administered 2014-08-30: 3 mL/kg/h via INTRAVENOUS

## 2014-08-30 MED ORDER — SODIUM CHLORIDE 0.9 % WEIGHT BASED INFUSION: 1.0000 mL/kg/h | INTRAVENOUS | Status: DC

## 2014-08-30 MED ORDER — SODIUM CHLORIDE 0.9 % IJ SOLN: 3.0000 mL | Freq: Two times a day (BID) | INTRAMUSCULAR | Status: DC

## 2014-08-30 MED ORDER — FENTANYL CITRATE (PF) 100 MCG/2ML IJ SOLN
INTRAMUSCULAR | Status: AC
  Filled 2014-08-30: qty 4

## 2014-08-30 MED ORDER — ASPIRIN 81 MG PO CHEW
81.0000 mg | CHEWABLE_TABLET | ORAL | Status: AC
  Administered 2014-08-30: 81 mg via ORAL

## 2014-08-30 MED ORDER — MIDAZOLAM HCL 2 MG/2ML IJ SOLN
INTRAMUSCULAR | Status: DC | PRN
  Administered 2014-08-30: 2 mg via INTRAVENOUS

## 2014-08-30 MED ORDER — LIDOCAINE HCL (PF) 1 % IJ SOLN
INTRAMUSCULAR | Status: AC
  Filled 2014-08-30: qty 30

## 2014-08-30 MED ORDER — HEPARIN SODIUM (PORCINE) 1000 UNIT/ML IJ SOLN
INTRAMUSCULAR | Status: AC
  Filled 2014-08-30: qty 1

## 2014-08-30 MED ORDER — SODIUM CHLORIDE 0.9 % IV SOLN: INTRAVENOUS | Status: AC

## 2014-08-30 MED ORDER — ASPIRIN 81 MG PO CHEW
CHEWABLE_TABLET | ORAL | Status: AC
  Filled 2014-08-30: qty 1

## 2014-08-30 MED ORDER — SODIUM CHLORIDE 0.9 % IV SOLN: 250.0000 mL | INTRAVENOUS | Status: DC | PRN

## 2014-08-30 MED ORDER — HEPARIN SODIUM (PORCINE) 1000 UNIT/ML IJ SOLN
INTRAMUSCULAR | Status: DC | PRN
  Administered 2014-08-30: 4000 [IU] via INTRAVENOUS

## 2014-08-30 MED ORDER — VERAPAMIL HCL 2.5 MG/ML IV SOLN
INTRAVENOUS | Status: DC | PRN
  Administered 2014-08-30: 08:00:00 via INTRA_ARTERIAL

## 2014-08-30 MED ORDER — SODIUM CHLORIDE 0.9 % IJ SOLN: 3.0000 mL | INTRAMUSCULAR | Status: DC | PRN

## 2014-08-30 MED ORDER — MIDAZOLAM HCL 2 MG/2ML IJ SOLN
INTRAMUSCULAR | Status: AC
  Filled 2014-08-30: qty 4

## 2014-08-30 SURGICAL SUPPLY — 14 items

## 2014-08-30 NOTE — H&P (View-Only) (Signed)
Patient ID: Darin Jones, male   DOB: 1955/09/06, 59 y.o.   MRN: [id number]    Date:  08/28/2014   ID:  Darin Jones, DOB July 23, 1955, MRN [id number]  PCP:  No primary care provider on file.  Primary Cardiologist:  New  No chief complaint on file.    History of Present Illness:  Darin Jones is a 59 y.o. male with no significant past medical history. He was seen at [facility] on July 31 for exertional chest pain and shortness of breath.  He ruled out for MI 2-D echocardiogram revealed ejection fraction 55-60% with no wall motion abnormalities.  He was set up and underwent nuclear stress testing as an outpatient which was read as having a medium-sized, mild partially reversible basal to mid inferior perfusion defect. This was suggestive of possible infarct with significant peri-infarct ischemia. Normal EF and wall motion.   Patient presented for follow-up with his stress test.  An interpreter was present during the interview and exam.  He appears to have periodic chest tightness and shortness of breath with activity but otherwise is doing well.  He seems to correlate nasal congestion with that tightness. Review of the hospital note indicates that was with exertion.  The patient currently denies nausea, vomiting, fever, shortness of breath, orthopnea, dizziness, PND, cough, abdominal pain, hematochezia, melena, lower extremity edema, claudication.  Wt Readings from Last 3 Encounters:  08/28/14 168 lb 3.2 oz (76.295 kg)  08/23/14 165 lb (74.844 kg)  08/18/14 165 lb 11.2 oz (75.161 kg)     Past Medical History  Diagnosis Date  . Medical history non-contributory     Current Outpatient Prescriptions  Medication Sig Dispense Refill  . aspirin EC 81 MG tablet Take 1 tablet (81 mg total) by mouth daily. 30 tablet 0  . cyclobenzaprine (FLEXERIL) 10 MG tablet Take 10 mg by mouth at bedtime.    . naproxen (NAPROSYN) 500 MG tablet Take 500 mg by mouth 2 (two) times daily with a meal.      . nitroGLYCERIN (NITROSTAT) 0.4 MG SL tablet Place 1 tablet (0.4 mg total) under the tongue every 5 (five) minutes as needed for chest pain. 15 tablet 0  . SALINE NASAL SPRAY NA Place 1 spray into both nostrils at bedtime as needed (congestion/ nasal drip).    [name] TO FIND To Whom It May Concern: It is my medical opinion that Darin Jones should stay out of work till he undergoes a stress test. If you have any questions or concerns, please don't hesitate to call. Sincerely, Osvaldo Shipper, MD [phone] [facility] 1 each 0   No current facility-administered medications for this visit.    Allergies:    Allergies  Allergen Reactions  . Plasticized Base [Plastibase] Rash    Social History:  The patient  reports that he has never smoked. He has never used smokeless tobacco. He reports that he drinks alcohol. He reports that he does not use illicit drugs.   Family history:   Family History  Problem Relation Age of Onset  . Hypertension Mother   . CAD Mother   . Diabetes Mother   . Asthma Father     ROS:  Please see the history of present illness.  All other systems reviewed and negative.   PHYSICAL EXAM: VS:  BP 122/80 mmHg  Pulse 100  Ht  (1.676 m)  Wt 168 lb 3.2 oz (76.295 kg)  BMI 27.16 kg/m2  SpO2 94% Well  nourished, well developed, in no acute distress HEENT: Pupils are equal round react to light accommodation extraocular movements are intact.  Neck: no JVDNo cervical lymphadenopathy. Cardiac: Regular rate and rhythm without murmurs rubs or gallops. Lungs:  clear to auscultation bilaterally, no wheezing, rhonchi or rales Abd: soft, nontender, positive bowel sounds all quadrants, no hepatosplenomegaly Ext: no lower extremity edema.  2+ radial and dorsalis pedis pulses. Skin: warm and dry Neuro:  Grossly normal    ASSESSMENT AND PLAN:  Abnormal nuclear stress test Medium-sized, mild partially reversible basal to mid inferior perfusion defect.  This was suggestive of possible infarct with significant peri-infarct ischemia.The patient will be scheduled for left heart catheterization.  We'll check all precatheterization labs.  The procedure and risks were reviewed including but not limited to death, myocardial infarction, stroke, arrythmias, bleeding, transfusion, emergency surgery, dye allergy, or renal dysfunction. The patient voices understanding and is agreeable to proceed.   We went over the procedure in detail through the use of an interpreter.  Showed him pictures of stents and how they are used.  I started him on Lopressor 12.5 mg twice daily. He is already taking aspirin.

## 2014-08-30 NOTE — Interval H&P Note (Signed)
History and Physical Interval Note:  08/30/2014 7:10 AM  Darin Jones  has presented today for cardiac cath with the diagnosis of unstable angina. The various methods of treatment have been discussed with the patient and family. After consideration of risks, benefits and other options for treatment, the patient has consented to  Procedure(s): Left Heart Cath and Coronary Angiography (N/A) as a surgical intervention .  The patient's history has been reviewed, patient examined, no change in status, stable for surgery.  I have reviewed the patient's chart and labs.  Questions were answered to the patient's satisfaction.    Cath Lab Visit (complete for each Cath Lab visit)  Clinical Evaluation Leading to the Procedure:   ACS: No.  Non-ACS:    Anginal Classification: CCS III  Anti-ischemic medical therapy: Minimal Therapy (1 class of medications)  Non-Invasive Test Results: Intermediate-risk stress test findings: cardiac mortality 1-3%/year  Prior CABG: No previous CABG         MCALHANY,CHRISTOPHER

## 2014-08-30 NOTE — Discharge Instructions (Signed)
Radial Site Care °Refer to this sheet in the next few weeks. These instructions provide you with information on caring for yourself after your procedure. Your caregiver may also give you more specific instructions. Your treatment has been planned according to current medical practices, but problems sometimes occur. Call your caregiver if you have any problems or questions after your procedure. °HOME CARE INSTRUCTIONS °· You may shower the day after the procedure. Remove the bandage (dressing) and gently wash the site with plain soap and water. Gently pat the site dry. °· Do not apply powder or lotion to the site. °· Do not submerge the affected site in water for 3 to 5 days. °· Inspect the site at least twice daily. °· Do not flex or bend the affected arm for 24 hours. °· No lifting over 5 pounds (2.3 kg) for 5 days after your procedure. °· Do not drive home if you are discharged the same day of the procedure. Have someone else drive you. °· You may drive 24 hours after the procedure unless otherwise instructed by your caregiver. °· Do not operate machinery or power tools for 24 hours. °· A responsible adult should be with you for the first 24 hours after you arrive home. °What to expect: °· Any bruising will usually fade within 1 to 2 weeks. °· Blood that collects in the tissue (hematoma) may be painful to the touch. It should usually decrease in size and tenderness within 1 to 2 weeks. °SEEK IMMEDIATE MEDICAL CARE IF: °· You have unusual pain at the radial site. °· You have redness, warmth, swelling, or pain at the radial site. °· You have drainage (other than a small amount of blood on the dressing). °· You have chills. °· You have a fever or persistent symptoms for more than 72 hours. °· You have a fever and your symptoms suddenly get worse. °· Your arm becomes pale, cool, tingly, or numb. °· You have heavy bleeding from the site. Hold pressure on the site. °Document Released: 02/06/2010 Document Revised:  03/29/2011 Document Reviewed: 02/06/2010 °ExitCare® Patient Information ©2015 [employer]. This information is not intended to replace advice given to you by your health care provider. Make sure you discuss any questions you have with your health care provider. ° °

## 2014-09-17 ENCOUNTER — Ambulatory Visit (INDEPENDENT_AMBULATORY_CARE_PROVIDER_SITE_OTHER): Payer: 59 | Admitting: Cardiology

## 2014-09-17 ENCOUNTER — Encounter: Payer: Self-pay | Admitting: Cardiology

## 2014-09-17 VITALS — BP 112/62 | HR 109 | Ht 67.0 in | Wt 172.2 lb

## 2014-09-17 DIAGNOSIS — R072 Precordial pain: Secondary | ICD-10-CM | POA: Diagnosis not present

## 2014-09-17 NOTE — Patient Instructions (Signed)
Medication Instructions:  Stay on Aspirin and Toprol   Labwork: -None  Testing/Procedures: -None  Follow-Up: -None  Any Other Special Instructions Will Be Listed Below (If Applicable).  Please get established with PCP Corinda Gubler).

## 2014-09-17 NOTE — Progress Notes (Signed)
09/17/2014 Darin Jones   02-Sep-1955  [id number]  Primary Physician No PCP Per Patient Primary Cardiologist: Dr. Clifton James   Reason for Visit/CC: Post hospital follow-up for chest pain; status post left heart catheterization  HPI:  Patient is a 59 year old Spanish-speaking male, who presents to clinic today for post hospital follow-up after undergoing recent left heart catheterization. The study was ordered given recent history of chest pain and a slightly abnormal nuclear stress test. The procedure was performed by Dr. 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 Continued medical therapy was recommended. He was continued on aspirin and Metroprolol. Laboratory work during his hospitalization revealed borderline diabetes with a hemoglobin A1c of 6.3. Lipids are slightly abnormal with LDL 109.  Since discharge from hospital, the patient reports that he has done fairly well. He denies any recurrent chest discomfort and no significant dyspnea. He reports full medication compliance with aspirin and Metroprolol. He denies any complications from his right radial access site.    He was under  Current Outpatient Prescriptions  Medication Sig Dispense Refill  . aspirin EC 81 MG tablet Take 1 tablet (81 mg total) by mouth daily. 30 tablet 0  . cetirizine (ZYRTEC) 10 MG tablet Take 10 mg by mouth daily.    . cyclobenzaprine (FLEXERIL) 10 MG tablet Take 10 mg by mouth at bedtime.    . metoprolol tartrate (LOPRESSOR) 25 MG tablet Take 0.5 tablets (12.5 mg total) by mouth 2 (two) times daily. 30 tablet 5  . naproxen (NAPROSYN) 500 MG tablet Take 500 mg by mouth 2 (two) times daily as needed for mild pain.     . nitroGLYCERIN (NITROSTAT) 0.4 MG SL tablet Place 1 tablet (0.4 mg total) under the tongue every 5 (five) minutes as needed for chest pain. 15 tablet 0  . SALINE NASAL SPRAY NA Place 1 spray into both nostrils at bedtime as  needed (congestion/ nasal drip).    [name] TO FIND To Whom It May Concern: It is my medical opinion that Darin Jones should stay out of work till he undergoes a stress test. If you have any questions or concerns, please don't hesitate to call. Sincerely, Osvaldo Shipper, MD [phone] [facility] 1 each 0   No current facility-administered medications for this visit.    Allergies  Allergen Reactions  . Plasticized Base [Plastibase] Rash    Social History   Social History  . Marital Status: Single    Spouse Name: N/A  . Number of Children: N/A  . Years of Education: N/A   Occupational History  . Not on file.   Social History Main Topics  . Smoking status: Never Smoker   . Smokeless tobacco: Never Used  . Alcohol Use: 0.0 oz/week    0 Standard drinks or equivalent per week     Comment: occ  . Drug Use: No  . Sexual Activity: Not on file   Other Topics Concern  . Not on file   Social History Narrative     Review of Systems: General: negative for chills, fever, night sweats or weight changes.  Cardiovascular: negative for chest pain, dyspnea on exertion, edema, orthopnea, palpitations, paroxysmal nocturnal dyspnea or shortness of breath Dermatological: negative for rash Respiratory: negative for cough or wheezing Urologic: negative for hematuria Abdominal: negative for nausea, vomiting, diarrhea, bright red blood per rectum, melena, or hematemesis Neurologic: negative for visual changes, syncope, or dizziness All other systems  reviewed and are otherwise negative except as noted above.    Blood pressure 112/62, pulse 109, height 5\' 7"  (1.702 m), weight 172 lb 2.7 oz (78.096 kg), SpO2 97 %.  General appearance: alert, cooperative and no distress Neck: no carotid bruit and no JVD Lungs: clear to auscultation bilaterally Heart: regular rate and rhythm, S1, S2 normal, no murmur, click, rub or gallop Extremities: no LEE Pulses: 2+ and symmetric Skin: warm  and dry Neurologic: Grossly normal  EKG Not performed  ASSESSMENT AND PLAN:   1. Nonobstructive CAD: Recent catheterization only showed mild, 25% distal LAD stenosis. Continue medical therapy and risk factor modification. Continue aspirin and beta blocker.  2. Hyperlipidemia: LDL slightly abnormal at 109. Will plan for lifestyle modification through diet and exercise as first-line therapy. The patient reports that he has started walking 30 minutes a day. He was encouraged to continue with daily physical activity.  3. Prediabetes: Hgb A1c was 6.3. He was notified of his risk for developing diabetes and was encouraged to adopt lifestyle modifications including increasing physical activity and adopting a heart healthy diet. We'll place referral to establishe care with a PCP.   PLAN  F/u in 1 yr.   Robbie Lis PA-C 09/17/2014 3:47 PM

## 2014-09-19 ENCOUNTER — Ambulatory Visit: Payer: 59 | Admitting: Family Medicine

## 2014-10-02 ENCOUNTER — Ambulatory Visit: Payer: 59 | Admitting: Cardiovascular Disease

## 2015-09-17 ENCOUNTER — Ambulatory Visit (INDEPENDENT_AMBULATORY_CARE_PROVIDER_SITE_OTHER): Payer: BLUE CROSS/BLUE SHIELD | Admitting: Cardiology

## 2015-09-17 ENCOUNTER — Encounter: Payer: Self-pay | Admitting: Cardiology

## 2015-09-17 VITALS — BP 116/90 | HR 72 | Ht 67.0 in | Wt 165.8 lb

## 2015-09-17 DIAGNOSIS — R072 Precordial pain: Secondary | ICD-10-CM | POA: Diagnosis not present

## 2015-09-17 DIAGNOSIS — I2510 Atherosclerotic heart disease of native coronary artery without angina pectoris: Secondary | ICD-10-CM | POA: Diagnosis not present

## 2015-09-17 MED ORDER — METOPROLOL TARTRATE 25 MG PO TABS: 25.0000 mg | ORAL_TABLET | Freq: Two times a day (BID) | ORAL | 5 refills | Status: DC

## 2015-09-17 NOTE — Progress Notes (Signed)
09/17/2015 Darin Jones   1955-06-05  [id number]  Primary Physician Tula Nakayama, MD Primary Cardiologist: Dr. Clifton Jones   Reason for Visit/CC: f/u for CAD  HPI:  Patient is a 60 year old male, from [place] followed by Dr. Aundra Jones, who presents to clinic today for routine follow-up. In 2016 he underwent a left heart catheterization. The study was ordered given chest pain and a slightly abnormal nuclear stress test. The procedure was performed by Dr. 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 Continued medical therapy was recommended. He was continued on aspirin and Metroprolol. Laboratory work during his hospitalization revealed borderline diabetes with a hemoglobin A1c of 6.3. Lipids were slightly abnormal with LDL at 109.   He presents to clinic today for 1 year follow-up. He has established care with a PCP who now follows his lipids and prediabetes. He reports that he recently had laboratory work conducted and both conditions were well-controlled through diet and exercise. He reports that he is currently not on any medications for his dyslipidemia nor his prediabetes.  From a cardiac standpoint, he reports that he has done well since his last OV. He denies any recurrent chest discomfort. No dyspnea. No lightheadedness, dizziness, syncope/near-syncope. He walks for exercise. He walks 3 days a week averaging 35 minutes at a time. No exertional symptoms. He reports full medication compliance. His diastolic blood pressure in clinic is slightly elevated in the 90s. He reports that his PCP also brought this to his attention that his diastolic pressures were a bit on the higher side. He is asymptomatic. EKG shows normal sinus rhythm. Heart rate 72 bpm.  Current Outpatient Prescriptions  Medication Sig Dispense Refill  . aspirin EC 81 MG tablet Take 1 tablet (81 mg total) by mouth daily. 30 tablet 0  . cetirizine  (ZYRTEC) 10 MG tablet Take 10 mg by mouth daily.    . cyclobenzaprine (FLEXERIL) 10 MG tablet Take 10 mg by mouth at bedtime.    . metoprolol tartrate (LOPRESSOR) 25 MG tablet Take 1 tablet (25 mg total) by mouth 2 (two) times daily. 30 tablet 5  . naproxen (NAPROSYN) 500 MG tablet Take 500 mg by mouth 2 (two) times daily as needed for mild pain.     . nitroGLYCERIN (NITROSTAT) 0.4 MG SL tablet Place 1 tablet (0.4 mg total) under the tongue every 5 (five) minutes as needed for chest pain. 15 tablet 0  . SALINE NASAL SPRAY NA Place 1 spray into both nostrils at bedtime as needed (congestion/ nasal drip).    Darin Jones TO FIND To Whom It May Concern: It is my medical opinion that Darin Jones should stay out of work till he undergoes a stress test. If you have any questions or concerns, please don't hesitate to call. Sincerely, Darin Shipper, MD [phone] [facility] 1 each 0   No current facility-administered medications for this visit.     Allergies  Allergen Reactions  . Plasticized Base [Plastibase] Rash    Social History   Social History  . Marital status: Single    Spouse name: N/A  . Number of children: N/A  . Years of education: N/A   Occupational History  . Not on file.   Social History Main Topics  . Smoking status: Never Smoker  . Smokeless tobacco: Never Used  . Alcohol use 0.0 oz/week     Comment: occ  . Drug use: No  . Sexual activity:  Not on file   Other Topics Concern  . Not on file   Social History Narrative  . No narrative on file     Review of Systems: General: negative for chills, fever, night sweats or weight changes.  Cardiovascular: negative for chest pain, dyspnea on exertion, edema, orthopnea, palpitations, paroxysmal nocturnal dyspnea or shortness of breath Dermatological: negative for rash Respiratory: negative for cough or wheezing Urologic: negative for hematuria Abdominal: negative for nausea, vomiting, diarrhea, bright red  blood per rectum, melena, or hematemesis Neurologic: negative for visual changes, syncope, or dizziness All other systems reviewed and are otherwise negative except as noted above.    Blood pressure 116/90, pulse 72, height 5\' 7"  (1.702 m), weight 165 lb 12.8 oz (75.2 kg).  General appearance: alert, cooperative and no distress Neck: no carotid bruit and no JVD Lungs: clear to auscultation bilaterally Heart: regular rate and rhythm, S1, S2 normal, no murmur, click, rub or gallop Extremities: no LEE Pulses: 2+ and symmetric Skin: warm and dry Neurologic: Grossly normal  EKG NSR. No ischemia. 72 bpm.    ASSESSMENT AND PLAN:   1. Nonobstructive CAD: Left catheterization in 2016 only showed mild, 25% distal LAD stenosis. He denies any CP. Continue medical therapy and risk factor modification. Continue aspirin and beta blocker.   2. Hyperlipidemia: LDL was slightly abnormal at 109 mg/dL a year ago. This is now followed by his PCP and he reports that it is diet controlled. Not currently on any meds.  We discussed low fat diet + continuation of routine physical activity.   3. Prediabetes: Hgb A1c was 6.3 previously. This is followed by his PCP.  4. HTN: systolic BP in the 90s. Patient reports his PCP told him that his diastolic pressure was elevated at last visit. HR in the 70s. We will increase his metoprolol from 12.5 mg BID to 25 mg BID.   PLAN  F/u with Dr. Clifton JamesMcAlhany in 1 year.   Darin LisBrittainy Erick Murin PA-C 09/17/2015 3:04 PM

## 2015-09-17 NOTE — Patient Instructions (Signed)
Your physician has recommended you make the following change in your medication:  1.) INCREASE METOPROLOL TO 25 MG TWO TIMES A DAY  Your physician wants you to follow-up in: 1 YEAR WITH DR. Clifton JamesMCALHANY.   You will receive a reminder letter in the mail two months in advance. If you don't receive a letter, please call our office to schedule the follow-up appointment.

## 2015-11-26 ENCOUNTER — Other Ambulatory Visit: Payer: Self-pay | Admitting: Cardiology

## 2015-11-26 NOTE — Telephone Encounter (Signed)
Rx has been sent to the pharmacy electronically. ° °

## 2016-03-02 ENCOUNTER — Other Ambulatory Visit: Payer: Self-pay | Admitting: Cardiology

## 2016-03-03 ENCOUNTER — Other Ambulatory Visit: Payer: Self-pay | Admitting: Cardiology

## 2016-03-04 ENCOUNTER — Other Ambulatory Visit: Payer: Self-pay | Admitting: Cardiology

## 2016-03-20 ENCOUNTER — Other Ambulatory Visit: Payer: Self-pay | Admitting: Cardiology

## 2016-04-20 ENCOUNTER — Other Ambulatory Visit: Payer: Self-pay | Admitting: Cardiovascular Disease

## 2017-02-21 ENCOUNTER — Ambulatory Visit: Payer: BLUE CROSS/BLUE SHIELD | Admitting: Physician Assistant

## 2017-02-21 ENCOUNTER — Encounter: Payer: Self-pay | Admitting: Physician Assistant

## 2017-02-21 ENCOUNTER — Encounter (INDEPENDENT_AMBULATORY_CARE_PROVIDER_SITE_OTHER): Payer: Self-pay

## 2017-02-21 VITALS — BP 140/82 | HR 91 | Ht 67.0 in | Wt 167.1 lb

## 2017-02-21 DIAGNOSIS — I10 Essential (primary) hypertension: Secondary | ICD-10-CM | POA: Diagnosis not present

## 2017-02-21 DIAGNOSIS — R7303 Prediabetes: Secondary | ICD-10-CM

## 2017-02-21 DIAGNOSIS — I2510 Atherosclerotic heart disease of native coronary artery without angina pectoris: Secondary | ICD-10-CM | POA: Diagnosis not present

## 2017-02-21 DIAGNOSIS — E785 Hyperlipidemia, unspecified: Secondary | ICD-10-CM

## 2017-02-21 MED ORDER — METOPROLOL TARTRATE 25 MG PO TABS: 25.0000 mg | ORAL_TABLET | Freq: Two times a day (BID) | ORAL | 11 refills | Status: DC

## 2017-02-21 NOTE — Patient Instructions (Signed)
Your physician recommends that you continue on your current medications as directed. Please refer to the Current Medication list given to you today.  Your physician wants you to follow-up in: YEAR WITH DR MCALHANY  You will receive a reminder letter in the mail two months in advance. If you don't receive a letter, please call our office to schedule the follow-up appointment. 

## 2017-02-21 NOTE — Progress Notes (Signed)
Cardiology Office Note    Date:  02/21/2017   ID:  Darin Jones, DOB 1955-12-05, MRN [id number]  PCP:  Tula Nakayama, MD  Cardiologist: Verne Carrow, MD  Chief Complaint  Patient presents with  . Follow-up    History of Present Illness:  Darin Jones is a 62 y.o. 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  He was continued on aspirin and metoprolol.  He is borderline diabetic and has hyperlipidemia.  He last saw Boyce Medici, PA-C 09/17/15 and was doing well.  Metoprolol was increased to 25 mg twice daily for hypertension.  Patient comes in today for yearly follow-up.  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  He denies any chest pain, palpitations, dyspnea, dyspnea on exertion, dizziness or presyncope.  He was recently diagnosed with asthma and is finishing a prescription for amoxicillin.  He was also given albuterol inhaler and cetirizine.  He takes metformin for his diabetes.  He sees primary care next month at which time he will have his blood work drawn and cholesterol check.   Past Medical History:  Diagnosis Date  . Medical history non-contributory     Past Surgical History:  Procedure Laterality Date  . ANKLE SURGERY    . CARDIAC CATHETERIZATION N/A 08/30/2014   Procedure: Left Heart Cath and Coronary Angiography;  Surgeon: Kathleene Hazel, MD;  Location: [facility] INVASIVE CV LAB;  Service: Cardiovascular;  Laterality: N/A;    Current Medications: Current Meds  Medication Sig  . amoxicillin-clavulanate (AUGMENTIN) 875-125 MG tablet Take 1 tablet by mouth 2 (two) times daily.  Marland Kitchen aspirin EC 81 MG tablet Take 1 tablet (81 mg total) by mouth daily.  . cetirizine (ZYRTEC) 10 MG tablet Take 10 mg by mouth daily.  . metFORMIN (GLUCOPHAGE-XR) 500 MG 24 hr tablet Take 500 mg by mouth 2 (two) times daily.  . metoprolol tartrate (LOPRESSOR) 25 MG tablet Take 1 tablet (25 mg  total) by mouth 2 (two) times daily.  . nitroGLYCERIN (NITROSTAT) 0.4 MG SL tablet Place 1 tablet (0.4 mg total) under the tongue every 5 (five) minutes as needed for chest pain.  Marland Kitchen PROAIR HFA 108 (90 Base) MCG/ACT inhaler Inhale 2 puffs into the lungs daily.  Marland Kitchen SALINE NASAL SPRAY NA Place 1 spray into both nostrils at bedtime as needed (congestion/ nasal drip).  [name] TO FIND To Whom It May Concern: It is my medical opinion that Darin Jones should stay out of work till he undergoes a stress test. If you have any questions or concerns, please don't hesitate to call. Sincerely, Osvaldo Shipper, MD [phone] [facility]  . [DISCONTINUED] metoprolol tartrate (LOPRESSOR) 25 MG tablet Take 1 tablet (25 mg total) by mouth 2 (two) times daily.  . [DISCONTINUED] metoprolol tartrate (LOPRESSOR) 25 MG tablet Take 1 tablet (25 mg total) by mouth 2 (two) times daily.     Allergies:   Plasticized base [plastibase]   Social History   Socioeconomic History  . Marital status: Single    Spouse name: None  . Number of children: None  . Years of education: None  . Highest education level: None  Social Needs  . Financial resource strain: None  . Food insecurity - worry: None  . Food insecurity - inability: None  . Transportation needs - medical: None  . Transportation needs - non-medical: None  Occupational History  . None  Tobacco Use  . Smoking status: Never Smoker  . Smokeless tobacco: Never Used  Substance and Sexual Activity  . Alcohol use: Yes    Alcohol/week: 0.0 oz    Comment: occ  . Drug use: No  . Sexual activity: None  Other Topics Concern  . None  Social History Narrative  . None     Family History:  The patient's family history includes Asthma in his father; CAD in his mother; Diabetes in his mother; Hypertension in his mother.   ROS:   Please see the history of present illness.    Review of Systems  Constitution: Negative.  HENT: Negative.     Cardiovascular: Negative.   Respiratory: Positive for wheezing.   Endocrine: Negative.   Hematologic/Lymphatic: Negative.   Musculoskeletal: Negative.   Gastrointestinal: Negative.   Genitourinary: Negative.   Neurological: Negative.    All other systems reviewed and are negative.   PHYSICAL EXAM:   VS:  BP 140/82   Pulse 91   Ht 5\' 7"  (1.702 m)   Wt 167 lb 1.9 oz (75.8 kg)   SpO2 98%   BMI 26.17 kg/m   Physical Exam  GEN: Well nourished, well developed, in no acute distress  Neck: no JVD, carotid bruits, or masses Cardiac:RRR; positive S4, no murmurs, rubs Respiratory:  clear to auscultation bilaterally, normal work of breathing GI: soft, nontender, nondistended, + BS Ext: without cyanosis, clubbing, or edema, Good distal pulses bilaterally Neuro:  Alert and Oriented x 3 Psych: euthymic mood, full affect  Wt Readings from Last 3 Encounters:  02/21/17 167 lb 1.9 oz (75.8 kg)  09/17/15 165 lb 12.8 oz (75.2 kg)  09/17/14 172 lb 2.7 oz (78.1 kg)      Studies/Labs Reviewed:   EKG:  EKG is  ordered today.  The ekg ordered today demonstrates normal sinus rhythm, normal EKG  Recent Labs: No results found for requested labs within last 8760 hours.   Lipid Panel    Component Value Date/Time   CHOL 169 08/19/2014 0327   TRIG 91 08/19/2014 0327   HDL 42 08/19/2014 0327   CHOLHDL 4.0 08/19/2014 0327   VLDL 18 08/19/2014 0327   LDLCALC 109 (H) 08/19/2014 0327    Additional studies/ records that were reviewed today include:  2D echo 08/19/14 Study Conclusions   - Left ventricle: The cavity size was normal. Wall thickness was   normal. Systolic function was normal. The estimated ejection   fraction was in the range of 55% to 60%. Doppler parameters are   consistent with abnormal left ventricular relaxation (grade 1   diastolic dysfunction). - Mitral valve: There was mild regurgitation.    Cardiac cath 8/12/16Conclusion    Dist LAD lesion, 25% stenosed.  The  left ventricular systolic function is normal.   1. Mild non-obstructive CAD 2. Normal LV systolic function 3. Non-cardiac chest pain   Recommendations: Medical management of CAD.      ASSESSMENT:    1. Coronary artery disease involving native coronary artery of native heart without angina pectoris   2. Essential hypertension   3. Hyperlipidemia, unspecified hyperlipidemia type   4. Pre-diabetes      PLAN:  In order of problems listed above:  CAD nonobstructive on cardiac cath 2016-25% distal LAD patient doing well without angina.  Continue aspirin and metoprolol.  Follow-up with Dr. Clifton James in 1 year.  Essential hypertension BP controlled with metoprolol  Hyperlipidemia controlled with diet at this point.  To have fasting lipid panel  by primary care next month.  Prediabetes managed with diet and exercise and metformin  Medication Adjustments/Labs and Tests Ordered: Current medicines are reviewed at length with the patient today.  Concerns regarding medicines are outlined above.  Medication changes, Labs and Tests ordered today are listed in the Patient Instructions below. Patient Instructions  Your physician recommends that you continue on your current medications as directed. Please refer to the Current Medication list given to you today.   Your physician wants you to follow-up in: YEAR WITH DR Lisette GrinderMCALHANY  You will receive a reminder letter in the mail two months in advance. If you don't receive a letter, please call our office to schedule the follow-up appointment.    Signed, Jacolyn ReedyMichele Skyeler Scalese, PA-C  02/21/2017 1:25 PM    [facility] [address], [place], [place]  [address] Phone: [phone]; Fax: [phone]) [phone]

## 2017-02-22 NOTE — Addendum Note (Signed)
Addended by: Michaelle CopasBAKER, Diesel Lina on: 02/22/2017 08:43 AM   Modules accepted: Orders

## 2018-01-25 ENCOUNTER — Other Ambulatory Visit: Payer: Self-pay | Admitting: Physician Assistant

## 2018-01-25 NOTE — Telephone Encounter (Signed)
Pt's pharmacy is requesting a refill on metformin. Would Dr. Clifton James like to refill this medication? Please address

## 2018-01-26 NOTE — Telephone Encounter (Signed)
Please send to primary care

## 2018-02-10 ENCOUNTER — Encounter: Payer: Self-pay | Admitting: Physician Assistant

## 2018-02-22 ENCOUNTER — Other Ambulatory Visit: Payer: Self-pay | Admitting: Physician Assistant

## 2018-03-01 ENCOUNTER — Encounter: Payer: Self-pay | Admitting: Physician Assistant

## 2018-03-01 NOTE — Progress Notes (Signed)
Cardiology Office Note    Date:  03/03/2018  ID:  Darin Jones, DOB 01-08-1956, MRN [id number] PCP:  Tula Nakayama, MD  Cardiologist:  Verne Carrow, MD   Chief Complaint: f/u CAD  History of Present Illness:  Darin Jones is a 63 y.o. 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 Last echo in 2016 showed EF 55-60%, grade 1 DD, mild MR. Last labs in 2016 showed K 3.8, Cr 1.01, WBC 10.6 (chronic), Hgb 15, LDL 109, TSH wnl, A1C 6.3. He reports his labs have since been followed by a clinic on [address] but he is not sure which one. He speaks some English but is accompanied by an interpreter to help facilitate understanding. He's remained active in his regular work with a lot of walking and denies any recent anginal symptoms or SOB. BP well controlled. No LEE, wheezing, diaphoresis or palpitations. His only complaint is sometimes when he drinks something really cold he feels his asthma flare up temporarily. His brothers all have this condition as well.    Past Medical History:  Diagnosis Date  . Diabetes mellitus (HCC)   . Essential hypertension   . Hyperlipidemia   . Medical history non-contributory   . Mild CAD    a. 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  . Mild mitral regurgitation     Past Surgical History:  Procedure Laterality Date  . ANKLE SURGERY    . CARDIAC CATHETERIZATION N/A 08/30/2014   Procedure: Left Heart Cath and Coronary Angiography;  Surgeon: Kathleene Hazel, MD;  Location: [facility] INVASIVE CV LAB;  Service: Cardiovascular;  Laterality: N/A;    Current Medications: Current Meds  Medication Sig  . aspirin EC 81 MG tablet Take 1 tablet (81 mg total) by mouth daily.  Marland Kitchen atorvastatin (LIPITOR) 20 MG tablet Take 20 mg by mouth at bedtime.  . cetirizine  (ZYRTEC) 10 MG tablet Take 10 mg by mouth daily as needed for allergies.  . metFORMIN (GLUCOPHAGE-XR) 500 MG 24 hr tablet TAKE ONE TABLET BY MOUTH EVERY DAY  . metoprolol tartrate (LOPRESSOR) 25 MG tablet TAKE ONE TABLET BY MOUTH TWICE DAILY  . PROAIR HFA 108 (90 Base) MCG/ACT inhaler Inhale 2 puffs into the lungs daily.  Marland Kitchen SALINE NASAL SPRAY NA Place 1 spray into both nostrils at bedtime as needed (congestion/ nasal drip).      Allergies:   Plasticized base [plastibase]   Social History   Socioeconomic History  . Marital status: Single    Spouse name: Not on file  . Number of children: Not on file  . Years of education: Not on file  . Highest education level: Not on file  Occupational History  . Not on file  Social Needs  . Financial resource strain: Not on file  . Food insecurity:    Worry: Not on file    Inability: Not on file  . Transportation needs:    Medical: Not on file    Non-medical: Not on file  Tobacco Use  . Smoking status: Never Smoker  . Smokeless tobacco: Never Used  Substance and Sexual Activity  . Alcohol use: Yes    Alcohol/week: 0.0 standard drinks    Comment: occ  . Drug use: No  . Sexual activity: Not on file  Lifestyle  . Physical  activity:    Days per week: Not on file    Minutes per session: Not on file  . Stress: Not on file  Relationships  . Social connections:    Talks on phone: Not on file    Gets together: Not on file    Attends religious service: Not on file    Active member of club or organization: Not on file    Attends meetings of clubs or organizations: Not on file    Relationship status: Not on file  Other Topics Concern  . Not on file  Social History Narrative  . Not on file     Family History:  The patient's family history includes Asthma in his father; CAD in his mother; Diabetes in his mother; Hypertension in his mother.  ROS:   Please see the history of present illness. All other systems are reviewed and otherwise  negative.    PHYSICAL EXAM:   VS:  BP 126/70   Pulse 85   Ht 5\' 7"  (1.702 m)   Wt 172 lb 12.8 oz (78.4 kg)   SpO2 97%   BMI 27.06 kg/m   BMI: Body mass index is 27.06 kg/m. GEN: Well nourished, well developed Hispanic M, in no acute distress HEENT: normocephalic, atraumatic Neck: no JVD, carotid bruits, or masses Cardiac: RRR; no murmurs, rubs, or gallops, no edema  Respiratory:  clear to auscultation bilaterally, normal work of breathing GI: soft, nontender, nondistended, + BS MS: no deformity or atrophy Skin: warm and dry, no rash Neuro:  Alert and Oriented x 3, Strength and sensation are intact, follows commands Psych: euthymic mood, full affect  Wt Readings from Last 3 Encounters:  03/03/18 172 lb 12.8 oz (78.4 kg)  02/21/17 167 lb 1.9 oz (75.8 kg)  09/17/15 165 lb 12.8 oz (75.2 kg)      Studies/Labs Reviewed:   EKG:  EKG was ordered today and personally reviewed by me and demonstrates NSR 82bpm, left axis deviation otherwise no acute changes. No change from previous.   Recent Labs: No results found for requested labs within last 8760 hours.   Lipid Panel    Component Value Date/Time   CHOL 169 08/19/2014 0327   TRIG 91 08/19/2014 0327   HDL 42 08/19/2014 0327   CHOLHDL 4.0 08/19/2014 0327   VLDL 18 08/19/2014 0327   LDLCALC 109 (H) 08/19/2014 0327    Additional studies/ records that were reviewed today include: Summarized above   ASSESSMENT & PLAN:   1. Mild CAD - minimal by cath 2015, asymptomatic on medical therapy with ASA, BB, statin. His lipids are monitored through PCP and I asked him to call their office when he can and request they fax us a copy of his labs for our review. He does not remember the name of the practice for us to call. The current PCP he has listed is different than who he has been seeing. Goal LDL would be <70 given mild CAD - consideration could be given to titrating atorvastatin if not at goal. 2. Mild MR - guidelines suggest  repeat echo every 3-5 years. Will go ahead and obtain. Doubt significant acute change on exam.  3. Essential HTN - controlled on metoprolol. This is also followed by PCP. Await faxed labs before making recommendation on potentially adding low dose ACEI/ARB given borderline DM. If sugar is well-controlled, we can continue present regimen since he is feeling great, but needs regular follow-up of both DM and renal function by primary care.  His last visit with them was about 4 months ago with labs per his report. 4. Hyperlipidemia - see above re: lipids/statin.  Disposition: F/u with Dr. Clifton JamesMcAlhany in 1 year.  Medication Adjustments/Labs and Tests Ordered: Current medicines are reviewed at length with the patient today.  Concerns regarding medicines are outlined above. Medication changes, Labs and Tests ordered today are summarized above and listed in the Patient Instructions accessible in Encounters.   Signed, Laurann Montanaayna N , PA-C  03/03/2018 3:48 PM    [facility] [address], [place], [place]  [address] Phone: [phone]; Fax: [phone]) [phone]

## 2018-03-03 ENCOUNTER — Ambulatory Visit (INDEPENDENT_AMBULATORY_CARE_PROVIDER_SITE_OTHER): Payer: BLUE CROSS/BLUE SHIELD | Admitting: Physician Assistant

## 2018-03-03 ENCOUNTER — Telehealth: Payer: Self-pay | Admitting: Physician Assistant

## 2018-03-03 ENCOUNTER — Encounter: Payer: Self-pay | Admitting: Physician Assistant

## 2018-03-03 ENCOUNTER — Encounter (INDEPENDENT_AMBULATORY_CARE_PROVIDER_SITE_OTHER): Payer: Self-pay

## 2018-03-03 VITALS — BP 126/70 | HR 85 | Ht 67.0 in | Wt 172.8 lb

## 2018-03-03 DIAGNOSIS — I10 Essential (primary) hypertension: Secondary | ICD-10-CM

## 2018-03-03 DIAGNOSIS — I2510 Atherosclerotic heart disease of native coronary artery without angina pectoris: Secondary | ICD-10-CM

## 2018-03-03 DIAGNOSIS — I340 Nonrheumatic mitral (valve) insufficiency: Secondary | ICD-10-CM

## 2018-03-03 DIAGNOSIS — E785 Hyperlipidemia, unspecified: Secondary | ICD-10-CM

## 2018-03-03 MED ORDER — METOPROLOL TARTRATE 25 MG PO TABS: 25.0000 mg | ORAL_TABLET | Freq: Two times a day (BID) | ORAL | 3 refills | Status: AC

## 2018-03-03 NOTE — Patient Instructions (Addendum)
Medication Instructions:  Your physician recommends that you continue on your current medications as directed. Please refer to the Current Medication list given to you today.  If you need a refill on your cardiac medications before your next appointment, please call your pharmacy.   Lab work: None ordered  If you have labs (blood work) drawn today and your tests are completely normal, you will receive your results only by: Marland Kitchen MyChart Message (if you have MyChart) OR . A paper copy in the mail If you have any lab test that is abnormal or we need to change your treatment, we will call you to review the results.  Testing/Procedures: Your physician has requested that you have an echocardiogram. Echocardiography is a painless test that uses sound waves to create images of your heart. It provides your doctor with information about the size and shape of your heart and how well your heart's chambers and valves are working. This procedure takes approximately one hour. There are no restrictions for this procedure.    Follow-Up: At [facility], you and your health needs are our priority.  As part of our continuing mission to provide you with exceptional heart care, we have created designated Provider Care Teams.  These Care Teams include your primary Cardiologist (physician) and Advanced Practice Providers (APPs -  Physician Assistants and Nurse Practitioners) who all work together to provide you with the care you need, when you need it. You will need a follow up appointment in 12 months.  Please call our office 2 months in advance to schedule this appointment.  You may see Verne Carrow, MD or one of the following Advanced Practice Providers on your designated Care Team:   [place], PA-C Ronie Spies, PA-C . Jacolyn Reedy, PA-C  Any Other Special Instructions Will Be Listed Below (If Applicable).  Please have your primary care dr send [place] y   Echocardiogram An echocardiogram is a  procedure that uses painless sound waves (ultrasound) to produce an image of the heart. Images from an echocardiogram can provide important information about:  Signs of coronary artery disease (CAD).  Aneurysm detection. An aneurysm is a weak or damaged part of an artery wall that bulges out from the normal force of blood pumping through the body.  Heart size and shape. Changes in the size or shape of the heart can be associated with certain conditions, including heart failure, aneurysm, and CAD.  Heart muscle function.  Heart valve function.  Signs of a past heart attack.  Fluid buildup around the heart.  Thickening of the heart muscle.  A tumor or infectious growth around the heart valves. Tell a health care provider about:  Any allergies you have.  All medicines you are taking, including vitamins, herbs, eye drops, creams, and over-the-counter medicines.  Any blood disorders you have.  Any surgeries you have had.  Any medical conditions you have.  Whether you are pregnant or may be pregnant. What are the risks? Generally, this is a safe procedure. However, problems may occur, including:  Allergic reaction to dye (contrast) that may be used during the procedure. What happens before the procedure? No specific preparation is needed. You may eat and drink normally. What happens during the procedure?   An IV tube may be inserted into one of your veins.  You may receive contrast through this tube. A contrast is an injection that improves the quality of the pictures from your heart.  A gel will be applied to your chest.  A wand-like tool (transducer) will be moved over your chest. The gel will help to transmit the sound waves from the transducer.  The sound waves will harmlessly bounce off of your heart to allow the heart images to be captured in real-time motion. The images will be recorded on a computer. The procedure may vary among health care providers and  hospitals. What happens after the procedure?  You may return to your normal, everyday life, including diet, activities, and medicines, unless your health care provider tells you not to do that. Summary  An echocardiogram is a procedure that uses painless sound waves (ultrasound) to produce an image of the heart.  Images from an echocardiogram can provide important information about the size and shape of your heart, heart muscle function, heart valve function, and fluid buildup around your heart.  You do not need to do anything to prepare before this procedure. You may eat and drink normally.  After the echocardiogram is completed, you may return to your normal, everyday life, unless your health care provider tells you not to do that. This information is not intended to replace advice given to you by your health care provider. Make sure you discuss any questions you have with your health care provider. Document Released: 01/02/2000 Document Revised: 02/07/2016 Document Reviewed: 02/07/2016 Elsevier Interactive Patient Education  2019 [employer].

## 2018-03-03 NOTE — Telephone Encounter (Signed)
Received e prescribing error for metoprolol. I called [facility], they said system is down so I called in rx as outlined. Keon Pender PA-C

## 2018-03-07 ENCOUNTER — Other Ambulatory Visit (HOSPITAL_COMMUNITY): Payer: BLUE CROSS/BLUE SHIELD

## 2018-05-16 ENCOUNTER — Other Ambulatory Visit: Payer: Self-pay | Admitting: Physician Assistant

## 2018-05-16 NOTE — Telephone Encounter (Signed)
We do not fill this medicine for the patient so would recommend he discuss with PCP

## 2018-05-18 NOTE — Telephone Encounter (Signed)
Hi Melissa, I see you routed this back to me. I already responded below. Will cc to [facility] too in case there is something else that needs to be done.

## 2021-04-25 ENCOUNTER — Emergency Department (HOSPITAL_COMMUNITY)
Admission: EM | Admit: 2021-04-25 | Discharge: 2021-04-25 | Disposition: A | Discharge status: Home / Self Care | Payer: Self-pay | Attending: Emergency Medicine | Admitting: Emergency Medicine

## 2021-04-25 ENCOUNTER — Emergency Department (HOSPITAL_COMMUNITY): Payer: Self-pay

## 2021-04-25 ENCOUNTER — Encounter (HOSPITAL_COMMUNITY): Payer: Self-pay | Admitting: Emergency Medicine

## 2021-04-25 ENCOUNTER — Other Ambulatory Visit: Payer: Self-pay

## 2021-04-25 DIAGNOSIS — E119 Type 2 diabetes mellitus without complications: Secondary | ICD-10-CM | POA: Insufficient documentation

## 2021-04-25 DIAGNOSIS — H538 Other visual disturbances: Secondary | ICD-10-CM | POA: Insufficient documentation

## 2021-04-25 DIAGNOSIS — Z7984 Long term (current) use of oral hypoglycemic drugs: Secondary | ICD-10-CM | POA: Insufficient documentation

## 2021-04-25 DIAGNOSIS — Z79899 Other long term (current) drug therapy: Secondary | ICD-10-CM | POA: Insufficient documentation

## 2021-04-25 DIAGNOSIS — Z7982 Long term (current) use of aspirin: Secondary | ICD-10-CM | POA: Insufficient documentation

## 2021-04-25 LAB — DIFFERENTIAL
Abs Immature Granulocytes: 0.03 10*3/uL (ref 0.00–0.07)
Basophils Absolute: 0.2 10*3/uL — ABNORMAL HIGH (ref 0.0–0.1)
Basophils Relative: 3 %
Eosinophils Absolute: 1.3 10*3/uL — ABNORMAL HIGH (ref 0.0–0.5)
Eosinophils Relative: 15 %
Immature Granulocytes: 0 %
Lymphocytes Relative: 28 %
Lymphs Abs: 2.5 10*3/uL (ref 0.7–4.0)
Monocytes Absolute: 0.9 10*3/uL (ref 0.1–1.0)
Monocytes Relative: 10 %
Neutro Abs: 3.9 10*3/uL (ref 1.7–7.7)
Neutrophils Relative %: 44 %

## 2021-04-25 LAB — COMPREHENSIVE METABOLIC PANEL
ALT: 28 U/L (ref 0–44)
AST: 23 U/L (ref 15–41)
Albumin: 4.1 g/dL (ref 3.5–5.0)
Alkaline Phosphatase: 62 U/L (ref 38–126)
Anion gap: 7 (ref 5–15)
BUN: 13 mg/dL (ref 8–23)
CO2: 27 mmol/L (ref 22–32)
Calcium: 9.5 mg/dL (ref 8.9–10.3)
Chloride: 105 mmol/L (ref 98–111)
Creatinine, Ser: 0.98 mg/dL (ref 0.61–1.24)
GFR, Estimated: >60 mL/min (ref >60)
Glucose, Bld: 143 mg/dL — ABNORMAL HIGH (ref 70–99)
Potassium: 3.7 mmol/L (ref 3.5–5.1)
Sodium: 139 mmol/L (ref 135–145)
Total Bilirubin: 1.1 mg/dL (ref 0.3–1.2)
Total Protein: 6.7 g/dL (ref 6.5–8.1)

## 2021-04-25 LAB — CBC
HCT: 45.4 % (ref 39.0–52.0)
Hemoglobin: 15.3 g/dL (ref 13.0–17.0)
MCH: 29.9 pg (ref 26.0–34.0)
MCHC: 33.7 g/dL (ref 30.0–36.0)
MCV: 88.8 fL (ref 80.0–100.0)
Platelets: 185 10*3/uL (ref 150–400)
RBC: 5.11 MIL/uL (ref 4.22–5.81)
RDW: 12.4 % (ref 11.5–15.5)
WBC: 8.9 10*3/uL (ref 4.0–10.5)
nRBC: 0 % (ref 0.0–0.2)

## 2021-04-25 LAB — PROTIME-INR
INR: 0.9 (ref 0.8–1.2)
Prothrombin Time: 12.4 seconds (ref 11.4–15.2)

## 2021-04-25 LAB — APTT: aPTT: 28 seconds (ref 24–36)

## 2021-04-25 LAB — CBG MONITORING, ED: Glucose-Capillary: 133 mg/dL — ABNORMAL HIGH (ref 70–99)

## 2021-04-25 MED ORDER — SODIUM CHLORIDE 0.9% FLUSH: 3.0000 mL | Freq: Once | INTRAVENOUS | Status: DC

## 2021-04-25 NOTE — ED Triage Notes (Signed)
Patient here with complaint of blurred vision that started on April 1. Reports blurred vision has been constant since then, denies numbness and weakness during that time period. Came to ED today with new complaint of an intermittent "electric shock" sensation on his face. Patient has no facial droop, no dysarthria, no arm drift, grip strength is equal bilaterally, sensation equal bilaterally in face, upper, and lower extremities. Patient is alert, oriented, ambulatory, and in no apparent distress at this time. ?

## 2021-04-25 NOTE — ED Provider Notes (Signed)
?[facility] ?Provider Note ? ? ?CSN: [id number] ?Arrival date & time: 04/25/21  1200 ? ?  ? ?History ? ?Chief Complaint  ?Patient presents with  ? Blurred Vision  ? ? ?Darin LedererRaul A Jones is a 66 y.o. male. ? ?The history is provided by the patient.  ?Eye Problem ?Location:  Left eye ?Severity:  Mild ?Onset quality:  Gradual ?Duration:  1 week ?Timing:  Constant ?Progression:  Unchanged ?Chronicity:  New ?Context comment:  Blurred vision in the left eye for one week, no pain. Can see light and fingers but not letters. Feels like stripes floating in back of the eye. No problem with right eye. No weakness or numbness. ?Relieved by:  Nothing ?Worsened by:  Nothing ?Associated symptoms: blurred vision and decreased vision   ?Associated symptoms: no crusting, no discharge, no double vision, no facial rash, no headaches, no inflammation, no itching, no nausea, no numbness, no photophobia, no redness, no swelling, no tearing, no tingling, no vomiting and no weakness   ?Risk factors comment:  DM ? ?  ? ?Home Medications ?Prior to Admission medications   ?Medication Sig Start Date End Date Taking? Authorizing Provider  ?aspirin EC 81 MG tablet Take 1 tablet (81 mg total) by mouth daily. 08/19/14   Osvaldo ShipperKrishnan, Gokul, MD  ?atorvastatin (LIPITOR) 20 MG tablet Take 20 mg by mouth at bedtime. 01/25/18   [provider]  ?cetirizine (ZYRTEC) 10 MG tablet Take 10 mg by mouth daily as needed for allergies.    [provider]  ?metFORMIN (GLUCOPHAGE-XR) 500 MG 24 hr tablet TAKE ONE TABLET BY MOUTH EVERY DAY 02/23/18   Dyann KiefLenze, Michele M, PA-C  ?metoprolol tartrate (LOPRESSOR) 25 MG tablet Take 1 tablet (25 mg total) by mouth 2 (two) times daily. 03/03/18   Dunn, Tacey Ruizayna N, PA-C  ?PROAIR HFA 108 (90 Base) MCG/ACT inhaler Inhale 2 puffs into the lungs daily. 02/08/17   [provider]  ?SALINE NASAL SPRAY NA Place 1 spray into both nostrils at bedtime as needed (congestion/ nasal drip).     [provider]  ?   ? ?Allergies    ?Plasticized base [plastibase]   ? ?Review of Systems   ?Review of Systems  ?Eyes:  Positive for blurred vision. Negative for double vision, photophobia, discharge, redness and itching.  ?Gastrointestinal:  Negative for nausea and vomiting.  ?Neurological:  Negative for tingling, weakness, numbness and headaches.  ? ?Physical Exam ?Updated Vital Signs ? ?ED Triage Vitals  ?Enc Vitals Group  ?   BP 04/25/21 1207 (!) 147/88  ?   Pulse Rate 04/25/21 1207 70  ?   Resp 04/25/21 1207 16  ?   Temp 04/25/21 1216 98.2 ?F (36.8 ?C)  ?   Temp Source 04/25/21 1216 Oral  ?   SpO2 04/25/21 1207 99 %  ?   Weight --   ?   Height --   ?   Head Circumference --   ?   Peak Flow --   ?   Pain Score 04/25/21 1216 0  ?   Pain Loc --   ?   Pain Edu? --   ?   Excl. in GC? --   ? ? ?Physical Exam ?Vitals and nursing note reviewed.  ?Constitutional:   ?   General: He is not in acute distress. ?   Appearance: He is well-developed. He is not ill-appearing.  ?HENT:  ?   Head: Normocephalic and atraumatic.  ?Eyes:  ?  Extraocular Movements: Extraocular movements intact.  ?   Conjunctiva/sclera: Conjunctivae normal.  ?   Pupils: Pupils are equal, round, and reactive to light.  ?   Comments: 20/20 vision in the right eye, patient can see light and fingers in the left eye but cannot make out any letters, pupils are equal and reactive, no pain with extraocular movement, no pupillary defect, no hemianopia or visual field deficit  ?Cardiovascular:  ?   Rate and Rhythm: Normal rate and regular rhythm.  ?   Pulses: Normal pulses.  ?   Heart sounds: Normal heart sounds. No murmur heard. ?Pulmonary:  ?   Effort: Pulmonary effort is normal. No respiratory distress.  ?   Breath sounds: Normal breath sounds.  ?Abdominal:  ?   Palpations: Abdomen is soft.  ?   Tenderness: There is no abdominal tenderness.  ?Musculoskeletal:     ?   General: No swelling.  ?   Cervical back: Normal range of motion and neck supple.   ?Skin: ?   General: Skin is warm and dry.  ?   Capillary Refill: Capillary refill takes less than 2 seconds.  ?Neurological:  ?   General: No focal deficit present.  ?   Mental Status: He is alert and oriented to person, place, and time.  ?   Cranial Nerves: No cranial nerve deficit.  ?   Sensory: No sensory deficit.  ?   Motor: No weakness.  ?   Coordination: Coordination normal.  ?   Comments: 5+ out of 5 strength throughout, normal sensation, no drift, normal finger-nose-finger, normal speech, no drift, normal gait  ?Psychiatric:     ?   Mood and Affect: Mood normal.  ? ? ?ED Results / Procedures / Treatments   ?Labs ?(all labs ordered are listed, but only abnormal results are displayed) ?Labs Reviewed  ?DIFFERENTIAL - Abnormal; Notable for the following components:  ?    Result Value  ? Eosinophils Absolute 1.3 (*)   ? Basophils Absolute 0.2 (*)   ? All other components within normal limits  ?COMPREHENSIVE METABOLIC PANEL - Abnormal; Notable for the following components:  ? Glucose, Bld 143 (*)   ? All other components within normal limits  ?CBG MONITORING, ED - Abnormal; Notable for the following components:  ? Glucose-Capillary 133 (*)   ? All other components within normal limits  ?PROTIME-INR  ?APTT  ?CBC  ?CBG MONITORING, ED  ? ? ?EKG ?EKG Interpretation ? ?Date/Time:  Saturday April 25 2021 11:57:38 EDT ?Ventricular Rate:  68 ?PR Interval:  140 ?QRS Duration: 92 ?QT Interval:  392 ?QTC Calculation: 416 ?R Axis:   -18 ?Text Interpretation: Normal sinus rhythm Normal ECG No significant change since last tracing Confirmed by Alvira Monday ([id number]) on 04/25/2021 1:44:29 PM ? ?Radiology ?CT HEAD WO CONTRAST ? ?Result Date: 04/25/2021 ?CLINICAL DATA:  Neuro deficit.  Stroke suspected. EXAM: CT HEAD WITHOUT CONTRAST TECHNIQUE: Contiguous axial images were obtained from the base of the skull through the vertex without intravenous contrast. RADIATION DOSE REDUCTION: This exam was performed according to the  departmental dose-optimization program which includes automated exposure control, adjustment of the mA and/or kV according to patient size and/or use of iterative reconstruction technique. COMPARISON:  None. FINDINGS: Brain: There is no evidence for acute hemorrhage, hydrocephalus, mass lesion, or abnormal extra-axial fluid collection. No definite CT evidence for acute infarction. Vascular: No hyperdense vessel or unexpected calcification. Skull: No evidence for fracture. No worrisome lytic or sclerotic lesion. Sinuses/Orbits: The visualized  paranasal sinuses and mastoid air cells are clear. Visualized portions of the globes and intraorbital fat are unremarkable. Other: None. IMPRESSION: Unremarkable.  No acute intracranial abnormality. Electronically Signed   By: Kennith Center M.D.   On: 04/25/2021 12:41   ? ?Procedures ?Procedures  ? ? ?Medications Ordered in ED ?Medications  ?sodium chloride flush (NS) 0.9 % injection 3 mL (3 mLs Intravenous Not Given 04/25/21 1229)  ? ? ?ED Course/ Medical Decision Making/ A&P ?  ?                        ?Medical Decision Making ?Amount and/or Complexity of Data Reviewed ?Labs: ordered. ?Radiology: ordered. ? ? ?Darin Jones is here with decreased vision/blurred vision in his left eye.  Normal vitals.  No fever.  History of diabetes.  On metformin.  Denies any pain, weakness, numbness.  He states that over the last week he has had blurred vision in his left eye.  Able to see large objects but not able to see letters.  He can see light in my fingers on eye exam but cannot make any letters on the eye chart.  He has normal extraocular movements, pupils are equal and reactive.  There is no proptosis, conjunctiva is normal.  No signs of infectious process around the eye.  He does not have any eye pain.  No other stroke symptoms.  No visual field deficit.  Overall suspect that this is likely retinal issue possibly retinal detachment or vitreous humor hemorrhage.  Talked with Dr. Sherryll Burger  with ophthalmology on the phone we will follow-up with the patient Monday.  Patient had lab work done prior to my evaluation and per my review and interpretation there is no significant anemia, electrolyte abnorm

## 2021-04-25 NOTE — Discharge Instructions (Signed)
Follow-up with ophthalmology.  Show up to Dr. Margaretmary Eddy office at 1 PM on Monday for appointment.  Please return if symptoms worsen including if you have decreased vision in your right eye.  Do not drive or do any dangerous activities until you are seen by ophthalmology. ?

## 2024-02-25 IMAGING — CT CT HEAD W/O CM
4 series · 14 of 47 positions shown, 16 images · non-contrast
Comparison: None.

CLINICAL DATA: Neuro deficit.  Stroke suspected.



[Series 2: head without · axial · non-contrast · 0.42mm/px · z∈[-106,+9]mm · 7 of 31 slices shown, 9 images]
[im 4/31  brain]
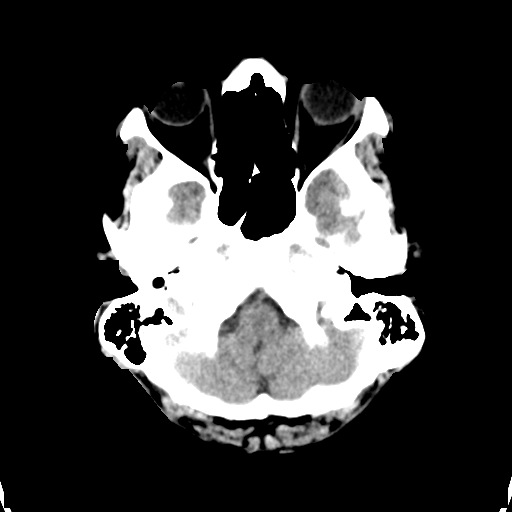
[im 4/31  bone]
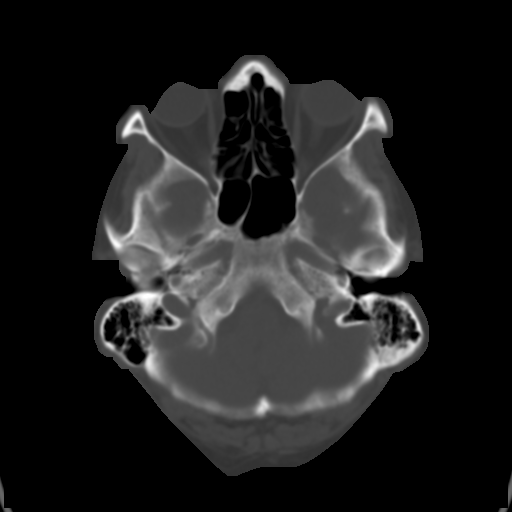
[im 8/31  brain]
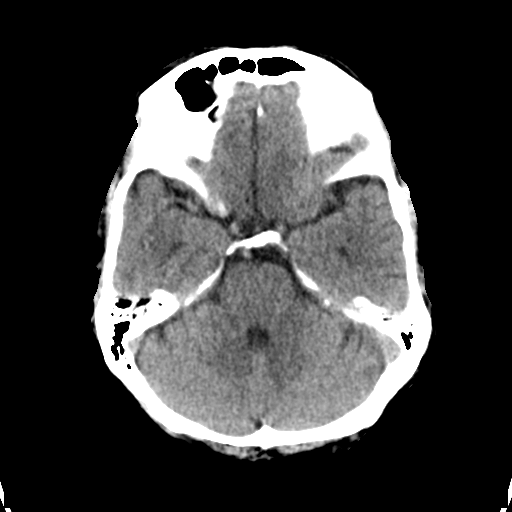
[im 12/31  brain]
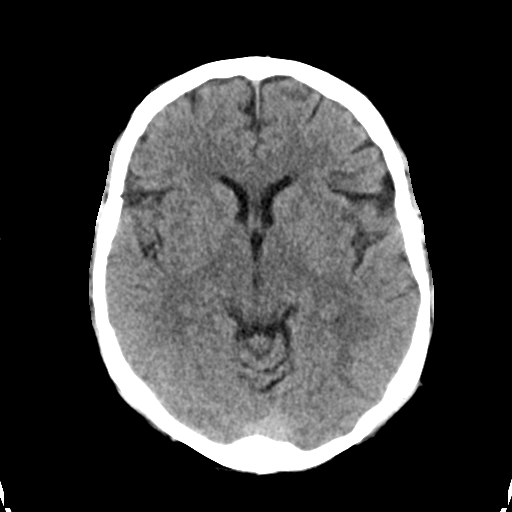
[im 16/31  brain]
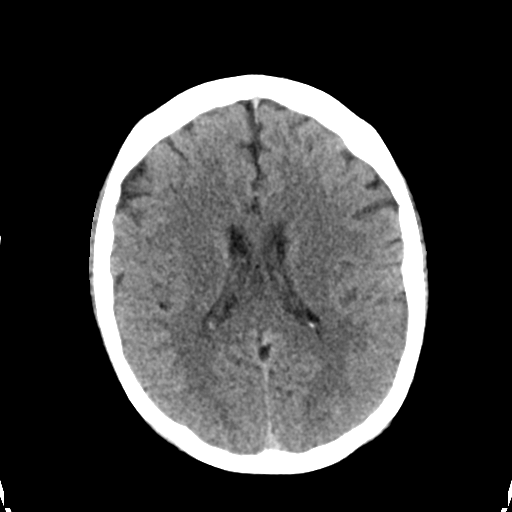
[im 19/31  brain]
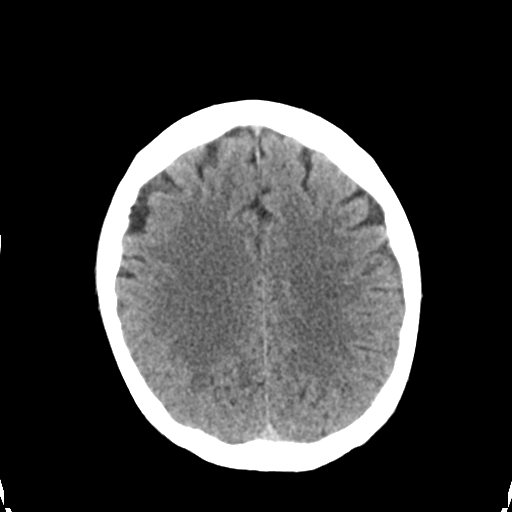
[im 19/31  bone]
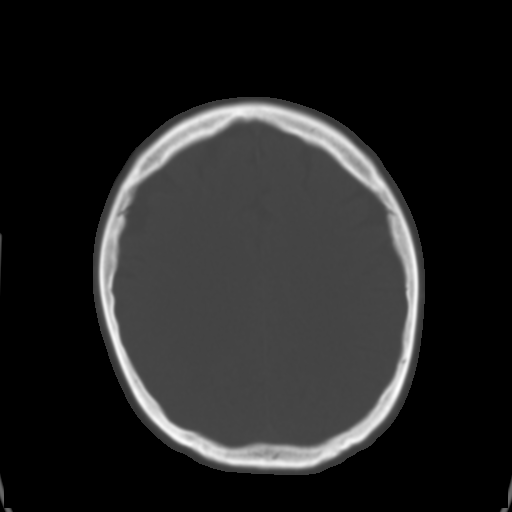
[im 23/31  brain]
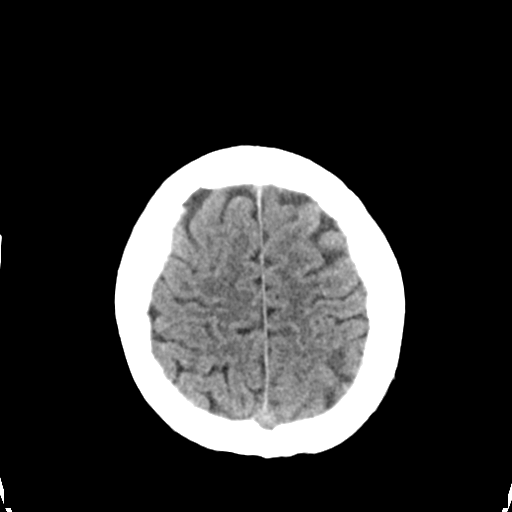
[im 27/31  brain]
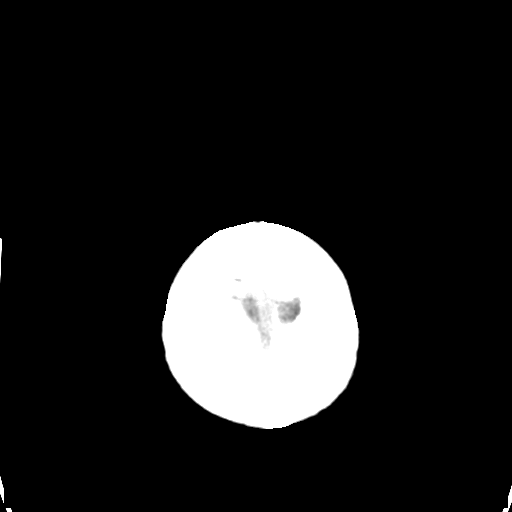

[Series 3: head bone · axial · 0.42mm/px · 1 of 78 slices shown]
[im 8/78  bone]
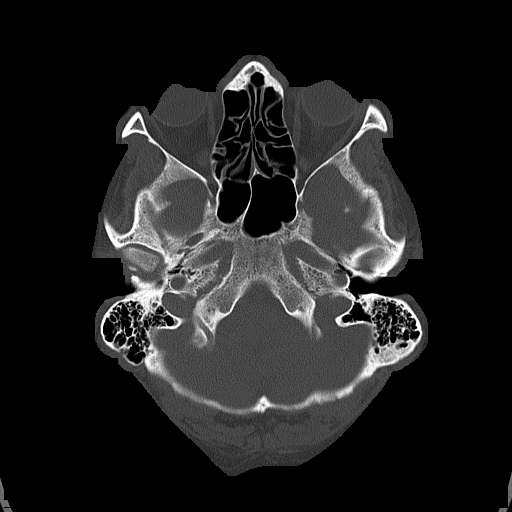

[Series 4: head without cor · coronal · non-contrast · 0.30mm/px · 3 of 67 slices shown]
[im 23/67  brain]
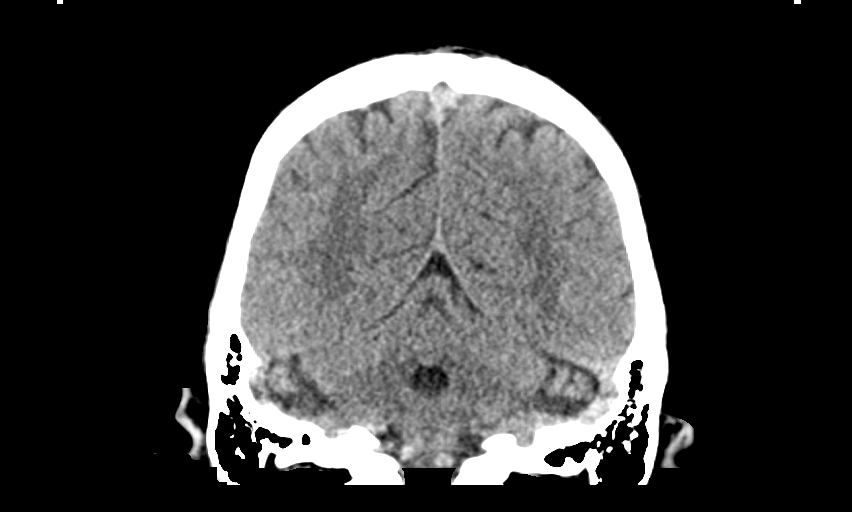
[im 30/67  brain]
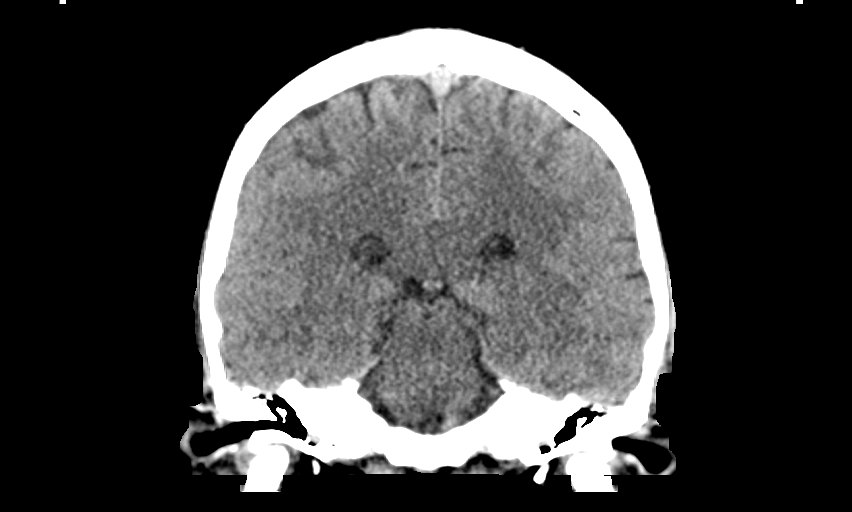
[im 37/67  brain]
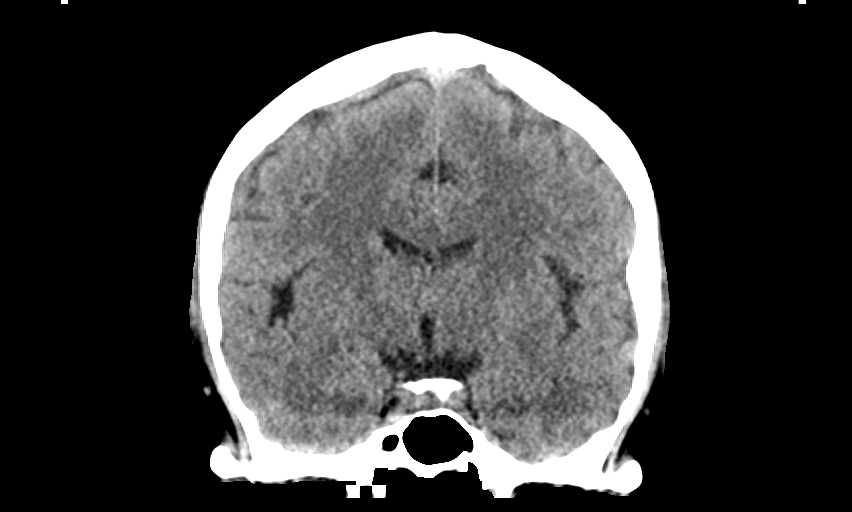

[Series 5: head without sag · sagittal · non-contrast · 0.30mm/px · 3 of 67 slices shown]
[im 23/67  brain]
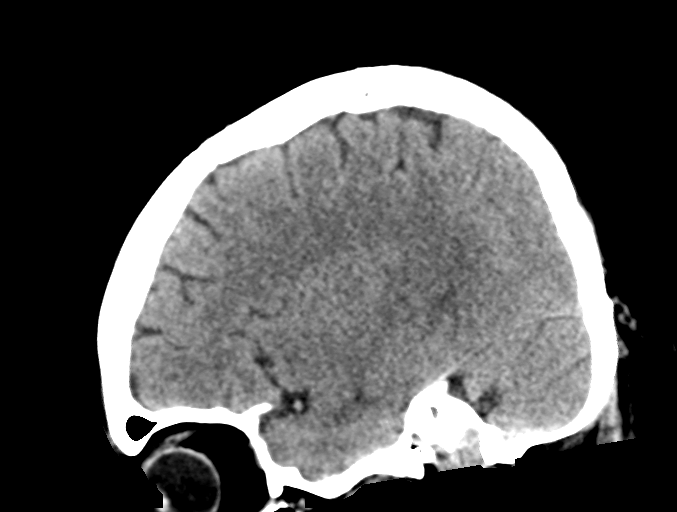
[im 34/67  brain]
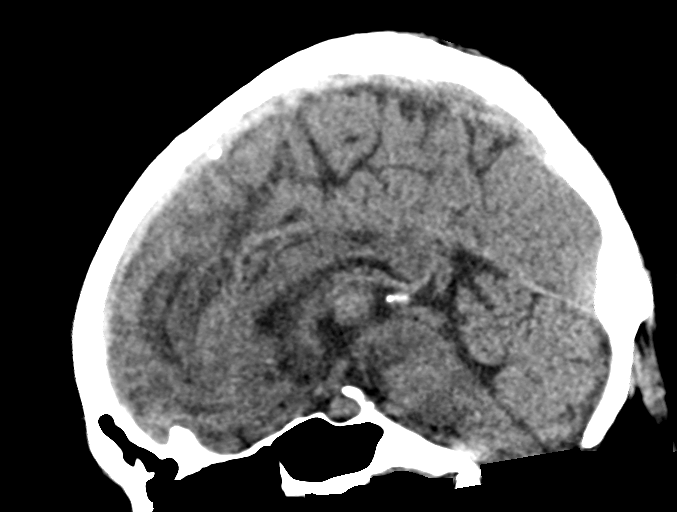
[im 45/67  brain]
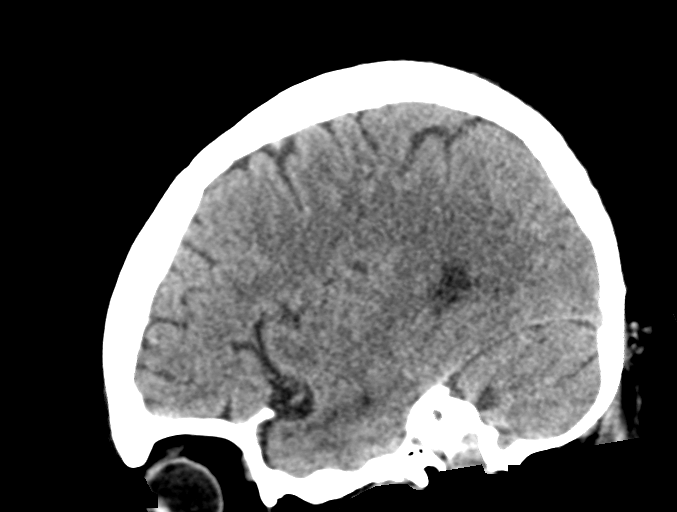

[14 of 47 positions shown; findings below may reference images not displayed]

FINDINGS: Brain: There is no evidence for acute hemorrhage, hydrocephalus,
mass lesion, or abnormal extra-axial fluid collection. No definite
CT evidence for acute infarction.

Vascular: No hyperdense vessel or unexpected calcification.

Skull: No evidence for fracture. No worrisome lytic or sclerotic
lesion.

Sinuses/Orbits: The visualized paranasal sinuses and mastoid air
cells are clear. Visualized portions of the globes and intraorbital
fat are unremarkable.

Other: None.
IMPRESSION: Unremarkable.  No acute intracranial abnormality.
